# Patient Record
Sex: Female | Born: 1980 | Hispanic: No | Marital: Married | Smoking: Former smoker
Health system: Southern US, Community
[De-identification: ages and names within clinical notes are randomized; demographics above are authoritative.]

## PROBLEM LIST (undated history)

## (undated) DIAGNOSIS — B019 Varicella without complication: Secondary | ICD-10-CM

## (undated) DIAGNOSIS — R51 Headache: Secondary | ICD-10-CM

## (undated) DIAGNOSIS — R519 Headache, unspecified: Secondary | ICD-10-CM

## (undated) HISTORY — DX: Headache, unspecified: R51.9

## (undated) HISTORY — PX: BREAST BIOPSY: SHX20

## (undated) HISTORY — PX: OTHER SURGICAL HISTORY: SHX169

## (undated) HISTORY — DX: Headache: R51

## (undated) HISTORY — DX: Varicella without complication: B01.9

---

## 2009-07-13 ENCOUNTER — Other Ambulatory Visit: Admission: RE | Admit: 2009-07-13 | Discharge: 2009-07-13 | Payer: Self-pay | Admitting: *Deleted

## 2010-05-27 ENCOUNTER — Emergency Department (HOSPITAL_COMMUNITY): Admission: EM | Admit: 2010-05-27 | Discharge: 2010-05-05 | Payer: Self-pay | Admitting: Emergency Medicine

## 2010-11-22 ENCOUNTER — Other Ambulatory Visit (HOSPITAL_COMMUNITY)
Admission: RE | Admit: 2010-11-22 | Discharge: 2010-11-22 | Disposition: A | Discharge status: Home / Self Care | Payer: Managed Care, Other (non HMO) | Source: Ambulatory Visit | Attending: Internal Medicine | Admitting: Internal Medicine

## 2010-11-23 ENCOUNTER — Other Ambulatory Visit: Payer: Self-pay

## 2010-11-23 DIAGNOSIS — Z01419 Encounter for gynecological examination (general) (routine) without abnormal findings: Secondary | ICD-10-CM | POA: Insufficient documentation

## 2011-06-03 ENCOUNTER — Other Ambulatory Visit: Payer: Self-pay | Admitting: Family Medicine

## 2011-06-03 DIAGNOSIS — N63 Unspecified lump in unspecified breast: Secondary | ICD-10-CM

## 2011-06-03 DIAGNOSIS — N644 Mastodynia: Secondary | ICD-10-CM

## 2011-06-06 ENCOUNTER — Other Ambulatory Visit: Payer: Self-pay | Admitting: Family Medicine

## 2011-06-06 DIAGNOSIS — N63 Unspecified lump in unspecified breast: Secondary | ICD-10-CM

## 2011-06-06 DIAGNOSIS — N644 Mastodynia: Secondary | ICD-10-CM

## 2011-06-21 NOTE — L&D Delivery Note (Signed)
Delivery Note A viable female was delivered via  OA Presentation Apgars 9 9 weight pending  Placenta status: spontaneously, intact with 3  Vessel cordCord:  with the following complications: none  Cord pH: not obtained  Anesthesia:  epidural Episiotomy: none Lacerations: none Suture Repair: not applicable Est. Blood Loss (mL): 300  Mom to postpartum.  Baby to nursery-stable.  Catherine Hall L 02/20/2012, 7:54 AM

## 2011-06-27 ENCOUNTER — Ambulatory Visit
Admission: RE | Admit: 2011-06-27 | Discharge: 2011-06-27 | Disposition: A | Discharge status: Home / Self Care | Payer: BC Managed Care – PPO | Source: Ambulatory Visit | Attending: Family Medicine | Admitting: Family Medicine

## 2011-06-27 DIAGNOSIS — N63 Unspecified lump in unspecified breast: Secondary | ICD-10-CM

## 2011-06-27 DIAGNOSIS — N644 Mastodynia: Secondary | ICD-10-CM

## 2011-07-18 LAB — OB RESULTS CONSOLE GC/CHLAMYDIA
Chlamydia: NEGATIVE
Gonorrhea: NEGATIVE

## 2011-07-18 LAB — OB RESULTS CONSOLE RUBELLA ANTIBODY, IGM: Rubella: IMMUNE

## 2011-07-18 LAB — OB RESULTS CONSOLE HEPATITIS B SURFACE ANTIGEN: Hepatitis B Surface Ag: NEGATIVE

## 2012-02-20 ENCOUNTER — Encounter (HOSPITAL_COMMUNITY): Payer: Self-pay | Admitting: *Deleted

## 2012-02-20 ENCOUNTER — Encounter (HOSPITAL_COMMUNITY): Payer: Self-pay | Admitting: Anesthesiology

## 2012-02-20 ENCOUNTER — Inpatient Hospital Stay (HOSPITAL_COMMUNITY)
Admission: AD | Admit: 2012-02-20 | Discharge: 2012-02-21 | DRG: 373 | Disposition: A | Discharge status: Home / Self Care | Payer: BC Managed Care – PPO | Source: Ambulatory Visit | Attending: Obstetrics and Gynecology | Admitting: Obstetrics and Gynecology

## 2012-02-20 ENCOUNTER — Inpatient Hospital Stay (HOSPITAL_COMMUNITY): Payer: BC Managed Care – PPO | Admitting: Anesthesiology

## 2012-02-20 LAB — CBC
HCT: 36.8 % (ref 36.0–46.0)
Hemoglobin: 12.5 g/dL (ref 12.0–15.0)
MCH: 30.3 pg (ref 26.0–34.0)
MCV: 89.3 fL (ref 78.0–100.0)
Platelets: 117 10*3/uL — ABNORMAL LOW (ref 150–400)
RBC: 4.12 MIL/uL (ref 3.87–5.11)

## 2012-02-20 MED ORDER — OXYCODONE-ACETAMINOPHEN 5-325 MG PO TABS: 1.0000 | ORAL_TABLET | ORAL | Status: DC | PRN

## 2012-02-20 MED ORDER — PHENYLEPHRINE 40 MCG/ML (10ML) SYRINGE FOR IV PUSH (FOR BLOOD PRESSURE SUPPORT)
80.0000 ug | PREFILLED_SYRINGE | INTRAVENOUS | Status: DC | PRN
  Administered 2012-02-20: 80 ug via INTRAVENOUS
  Filled 2012-02-20: qty 5

## 2012-02-20 MED ORDER — WITCH HAZEL-GLYCERIN EX PADS: 1.0000 "application " | MEDICATED_PAD | CUTANEOUS | Status: DC | PRN

## 2012-02-20 MED ORDER — BISACODYL 10 MG RE SUPP: 10.0000 mg | Freq: Every day | RECTAL | Status: DC | PRN

## 2012-02-20 MED ORDER — LANOLIN HYDROUS EX OINT: TOPICAL_OINTMENT | CUTANEOUS | Status: DC | PRN

## 2012-02-20 MED ORDER — EPHEDRINE 5 MG/ML INJ
10.0000 mg | INTRAVENOUS | Status: DC | PRN
  Filled 2012-02-20: qty 4

## 2012-02-20 MED ORDER — EPHEDRINE 5 MG/ML INJ: 10.0000 mg | INTRAVENOUS | Status: DC | PRN

## 2012-02-20 MED ORDER — PHENYLEPHRINE 40 MCG/ML (10ML) SYRINGE FOR IV PUSH (FOR BLOOD PRESSURE SUPPORT): 80.0000 ug | PREFILLED_SYRINGE | INTRAVENOUS | Status: DC | PRN

## 2012-02-20 MED ORDER — CITRIC ACID-SODIUM CITRATE 334-500 MG/5ML PO SOLN: 30.0000 mL | ORAL | Status: DC | PRN

## 2012-02-20 MED ORDER — ZOLPIDEM TARTRATE 5 MG PO TABS: 5.0000 mg | ORAL_TABLET | Freq: Every evening | ORAL | Status: DC | PRN

## 2012-02-20 MED ORDER — DIPHENHYDRAMINE HCL 50 MG/ML IJ SOLN: 12.5000 mg | INTRAMUSCULAR | Status: DC | PRN

## 2012-02-20 MED ORDER — LACTATED RINGERS IV SOLN
INTRAVENOUS | Status: DC
  Administered 2012-02-20: 06:00:00 via INTRAVENOUS

## 2012-02-20 MED ORDER — OXYTOCIN BOLUS FROM INFUSION
250.0000 mL | Freq: Once | INTRAVENOUS | Status: DC
  Filled 2012-02-20: qty 500

## 2012-02-20 MED ORDER — MEASLES, MUMPS & RUBELLA VAC ~~LOC~~ INJ: 0.5000 mL | INJECTION | Freq: Once | SUBCUTANEOUS | Status: DC

## 2012-02-20 MED ORDER — FENTANYL 2.5 MCG/ML BUPIVACAINE 1/10 % EPIDURAL INFUSION (WH - ANES)
14.0000 mL/h | INTRAMUSCULAR | Status: DC
  Filled 2012-02-20: qty 60

## 2012-02-20 MED ORDER — LACTATED RINGERS IV SOLN
500.0000 mL | Freq: Once | INTRAVENOUS | Status: AC
  Administered 2012-02-20: 500 mL via INTRAVENOUS

## 2012-02-20 MED ORDER — OXYTOCIN 40 UNITS IN LACTATED RINGERS INFUSION - SIMPLE MED
62.5000 mL/h | Freq: Once | INTRAVENOUS | Status: DC
  Filled 2012-02-20: qty 1000

## 2012-02-20 MED ORDER — ONDANSETRON HCL 4 MG/2ML IJ SOLN: 4.0000 mg | Freq: Four times a day (QID) | INTRAMUSCULAR | Status: DC | PRN

## 2012-02-20 MED ORDER — IBUPROFEN 600 MG PO TABS
600.0000 mg | ORAL_TABLET | Freq: Four times a day (QID) | ORAL | Status: DC
  Administered 2012-02-20 – 2012-02-21 (×5): 600 mg via ORAL
  Filled 2012-02-20 (×5): qty 1

## 2012-02-20 MED ORDER — BENZOCAINE-MENTHOL 20-0.5 % EX AERO
1.0000 "application " | INHALATION_SPRAY | CUTANEOUS | Status: DC | PRN
  Filled 2012-02-20: qty 56

## 2012-02-20 MED ORDER — PRENATAL MULTIVITAMIN CH
1.0000 | ORAL_TABLET | Freq: Every day | ORAL | Status: DC
  Administered 2012-02-20 – 2012-02-21 (×2): 1 via ORAL
  Filled 2012-02-20 (×2): qty 1

## 2012-02-20 MED ORDER — IBUPROFEN 600 MG PO TABS: 600.0000 mg | ORAL_TABLET | Freq: Four times a day (QID) | ORAL | Status: DC | PRN

## 2012-02-20 MED ORDER — TETANUS-DIPHTH-ACELL PERTUSSIS 5-2.5-18.5 LF-MCG/0.5 IM SUSP: 0.5000 mL | Freq: Once | INTRAMUSCULAR | Status: DC

## 2012-02-20 MED ORDER — FLEET ENEMA 7-19 GM/118ML RE ENEM: 1.0000 | ENEMA | RECTAL | Status: DC | PRN

## 2012-02-20 MED ORDER — SIMETHICONE 80 MG PO CHEW: 80.0000 mg | CHEWABLE_TABLET | ORAL | Status: DC | PRN

## 2012-02-20 MED ORDER — SODIUM BICARBONATE 8.4 % IV SOLN
INTRAVENOUS | Status: DC | PRN
  Administered 2012-02-20: 4 mL via EPIDURAL

## 2012-02-20 MED ORDER — ONDANSETRON HCL 4 MG/2ML IJ SOLN: 4.0000 mg | INTRAMUSCULAR | Status: DC | PRN

## 2012-02-20 MED ORDER — ONDANSETRON HCL 4 MG PO TABS: 4.0000 mg | ORAL_TABLET | ORAL | Status: DC | PRN

## 2012-02-20 MED ORDER — MEDROXYPROGESTERONE ACETATE 150 MG/ML IM SUSP: 150.0000 mg | INTRAMUSCULAR | Status: DC | PRN

## 2012-02-20 MED ORDER — FLEET ENEMA 7-19 GM/118ML RE ENEM: 1.0000 | ENEMA | Freq: Every day | RECTAL | Status: DC | PRN

## 2012-02-20 MED ORDER — SENNOSIDES-DOCUSATE SODIUM 8.6-50 MG PO TABS
2.0000 | ORAL_TABLET | Freq: Every day | ORAL | Status: DC
  Administered 2012-02-20: 2 via ORAL

## 2012-02-20 MED ORDER — LIDOCAINE HCL (PF) 1 % IJ SOLN
30.0000 mL | INTRAMUSCULAR | Status: DC | PRN
  Filled 2012-02-20: qty 30

## 2012-02-20 MED ORDER — LACTATED RINGERS IV SOLN: 500.0000 mL | INTRAVENOUS | Status: DC | PRN

## 2012-02-20 MED ORDER — DIPHENHYDRAMINE HCL 25 MG PO CAPS: 25.0000 mg | ORAL_CAPSULE | Freq: Four times a day (QID) | ORAL | Status: DC | PRN

## 2012-02-20 MED ORDER — DIBUCAINE 1 % RE OINT: 1.0000 "application " | TOPICAL_OINTMENT | RECTAL | Status: DC | PRN

## 2012-02-20 MED ORDER — ACETAMINOPHEN 325 MG PO TABS: 650.0000 mg | ORAL_TABLET | ORAL | Status: DC | PRN

## 2012-02-20 MED ORDER — FENTANYL 2.5 MCG/ML BUPIVACAINE 1/10 % EPIDURAL INFUSION (WH - ANES)
INTRAMUSCULAR | Status: DC | PRN
  Administered 2012-02-20: 14 mL/h via EPIDURAL

## 2012-02-20 NOTE — MAU Note (Signed)
PT SAYS SHE STARTED HURTING BAD AT 0305..  2 CM IN OFFICE  LAST WEEK.  DENIES HSV AND MRSA

## 2012-02-20 NOTE — Anesthesia Procedure Notes (Addendum)
Epidural Patient location during procedure: OB  Preanesthetic Checklist Completed: patient identified, site marked, surgical consent, pre-op evaluation, timeout performed, IV checked, risks and benefits discussed and monitors and equipment checked  Epidural Patient position: sitting Prep: site prepped and draped and DuraPrep Patient monitoring: continuous pulse ox and blood pressure Approach: midline Injection technique: LOR air  Needle:  Needle type: Tuohy  Needle gauge: 17 G Needle length: 9 cm and 9 Needle insertion depth: 3 cm Catheter type: closed end flexible Catheter size: 19 Gauge Catheter at skin depth: 9 cm Test dose: negative  Assessment Events: blood not aspirated, injection not painful, no injection resistance, negative IV test and no paresthesia  Additional Notes Dosing of Epidural:  1st dose, through needle ............................................Marland Kitchen epi 1:200K + Xylocaine 40 mg  2nd dose, through catheter, after waiting 3 minutes...Marland KitchenMarland Kitchenepi 1:200K + Xylocaine 40 mg  3rd dose, through catheter after waiting 3 minutes .............................Marcaine   4mg    ( mg Marcaine are expressed as equivilent  cc's medication removed from the 0.1%Bupiv / fentanyl syringe from L&D pump)  ( 2% Xylo charted as a single dose in Epic Meds for ease of charting; actual dosing was fractionated as above, for saftey's sake)  As each dose occurred, patient was free of IV sx; and patient exhibited no evidence of SA injection.  Patient is more comfortable after epidural dosed. Please see RN's note for documentation of vital signs,and FHR which are stable.  Patient reminded not to try to ambulate with numb legs, and that an RN must be present the 1st time she attempts to get up.

## 2012-02-20 NOTE — MAU Note (Signed)
Pt reports contractions, spotting.

## 2012-02-20 NOTE — Anesthesia Preprocedure Evaluation (Signed)

## 2012-02-20 NOTE — Progress Notes (Signed)
MD in- Pt waiting on epidural. MD will come back after epidural.

## 2012-02-20 NOTE — H&P (Signed)
31 year old G 4 P 2012 at 39 weeks presents in active labor. PNC See [place] NSVD x 2  GBBS is negative Afebrile Vital signs stable General alert and oriented Lung CTAB Car RRR Abdomen is soft and non tender Cervix is 7 - 8 cm 90% -1 per triage nurse  IMPRESSION: IUP at term Labor  PLAN: Patient wants epidural AROM after epidural

## 2012-02-20 NOTE — Progress Notes (Signed)
Patient is comfortable after epidural Fetal heart rate category 1 toco ucs every 3 minutes Cervix is C/9/0 AROM Clear fluid  IMPRESSION: IUP at term Labor  PLAN: Anticipate NSVD

## 2012-02-21 LAB — CBC
MCV: 90.5 fL (ref 78.0–100.0)
Platelets: 115 10*3/uL — ABNORMAL LOW (ref 150–400)
RBC: 3.37 MIL/uL — ABNORMAL LOW (ref 3.87–5.11)
RDW: 13.5 % (ref 11.5–15.5)
WBC: 10.4 10*3/uL (ref 4.0–10.5)

## 2012-02-21 MED ORDER — IBUPROFEN 600 MG PO TABS: 600.0000 mg | ORAL_TABLET | Freq: Four times a day (QID) | ORAL | Status: AC

## 2012-02-21 NOTE — Progress Notes (Signed)
Post Partum Day 1 Subjective: no complaints, up ad lib, voiding, tolerating PO and desires early discharge  Objective: Blood pressure 96/61, pulse 83, temperature 97.6 F (36.4 C), temperature source Oral, resp. rate 18, height 5\' 6"  (1.676 m), weight 58.968 kg (130 lb), SpO2 97.00%, unknown if currently breastfeeding.  Physical Exam:  General: alert and cooperative Lochia: appropriate Uterine Fundus: firm Incision: perineum intact DVT Evaluation: No evidence of DVT seen on physical exam.   Basename 02/21/12 0522 02/20/12 0535  HGB 10.5* 12.5  HCT 30.5* 36.8    Assessment/Plan: Discharge home   LOS: 1 day   Anthonette Lesage G 02/21/2012, 8:12 AM

## 2012-02-21 NOTE — Anesthesia Postprocedure Evaluation (Signed)
  Anesthesia Post-op Note  Patient: Catherine Hall  Procedure(s) Performed: * No procedures listed *  Patient Location: PACU and Mother/Baby  Anesthesia Type: Epidural  Level of Consciousness: awake, alert  and oriented  Airway and Oxygen Therapy: Patient Spontanous Breathing  Post-op Pain: mild  Post-op Assessment: Patient's Cardiovascular Status Stable, Respiratory Function Stable, No signs of Nausea or vomiting and Pain level controlled  Post-op Vital Signs: stable  Complications: No apparent anesthesia complications

## 2012-02-21 NOTE — Discharge Summary (Signed)
Obstetric Discharge Summary Reason for Admission: onset of labor Prenatal Procedures: ultrasound Intrapartum Procedures: spontaneous vaginal delivery Postpartum Procedures: none Complications-Operative and Postpartum: none Hemoglobin  Date Value Range Status  02/21/2012 10.5* 12.0 - 15.0 g/dL Final     HCT  Date Value Range Status  02/21/2012 30.5* 36.0 - 46.0 % Final    Physical Exam:  General: alert and cooperative Lochia: appropriate Uterine Fundus: firm Incision: perineum intact DVT Evaluation: No evidence of DVT seen on physical exam.  Discharge Diagnoses: Term Pregnancy-delivered  Discharge Information: Date: 02/21/2012 Activity: pelvic rest Diet: routine Medications: PNV and Ibuprofen Condition: stable Instructions: refer to practice specific booklet Discharge to: home   Newborn Data: Live born female  Birth Weight: 6 lb 9.8 oz (2999 g) APGAR: 8, 9  Home with mother.  Orian Amberg G 02/21/2012, 8:20 AM

## 2012-02-23 ENCOUNTER — Encounter (HOSPITAL_COMMUNITY): Payer: Self-pay | Admitting: *Deleted

## 2012-02-29 ENCOUNTER — Encounter (HOSPITAL_COMMUNITY): Payer: Self-pay | Admitting: *Deleted

## 2012-03-20 LAB — HM PAP SMEAR: HM Pap smear: NORMAL

## 2012-03-22 ENCOUNTER — Other Ambulatory Visit: Payer: Self-pay | Admitting: Obstetrics and Gynecology

## 2012-12-31 ENCOUNTER — Emergency Department (HOSPITAL_COMMUNITY): Payer: BC Managed Care – PPO

## 2012-12-31 ENCOUNTER — Encounter (HOSPITAL_COMMUNITY): Payer: Self-pay | Admitting: *Deleted

## 2012-12-31 ENCOUNTER — Emergency Department (HOSPITAL_COMMUNITY)
Admission: EM | Admit: 2012-12-31 | Discharge: 2012-12-31 | Discharge status: Against Medical Advice | Payer: BC Managed Care – PPO | Attending: Emergency Medicine | Admitting: Emergency Medicine

## 2012-12-31 DIAGNOSIS — Z79899 Other long term (current) drug therapy: Secondary | ICD-10-CM | POA: Insufficient documentation

## 2012-12-31 DIAGNOSIS — R0602 Shortness of breath: Secondary | ICD-10-CM | POA: Insufficient documentation

## 2012-12-31 DIAGNOSIS — R072 Precordial pain: Secondary | ICD-10-CM | POA: Insufficient documentation

## 2012-12-31 DIAGNOSIS — R079 Chest pain, unspecified: Secondary | ICD-10-CM

## 2012-12-31 DIAGNOSIS — Z87891 Personal history of nicotine dependence: Secondary | ICD-10-CM | POA: Insufficient documentation

## 2012-12-31 LAB — BASIC METABOLIC PANEL
BUN: 11 mg/dL (ref 6–23)
CO2: 31 mEq/L (ref 19–32)
Chloride: 101 mEq/L (ref 96–112)
Creatinine, Ser: 0.63 mg/dL (ref 0.50–1.10)
GFR calc Af Amer: >90 mL/min (ref >90)
Glucose, Bld: 117 mg/dL — ABNORMAL HIGH (ref 70–99)
Potassium: 4 mEq/L (ref 3.5–5.1)

## 2012-12-31 LAB — CBC
HCT: 40.6 % (ref 36.0–46.0)
Hemoglobin: 13.9 g/dL (ref 12.0–15.0)
MCV: 87.1 fL (ref 78.0–100.0)
RBC: 4.66 MIL/uL (ref 3.87–5.11)
RDW: 12.3 % (ref 11.5–15.5)
WBC: 5.1 10*3/uL (ref 4.0–10.5)

## 2012-12-31 MED ORDER — GI COCKTAIL ~~LOC~~
30.0000 mL | Freq: Once | ORAL | Status: AC
  Administered 2012-12-31: 30 mL via ORAL
  Filled 2012-12-31: qty 30

## 2012-12-31 NOTE — ED Provider Notes (Signed)
History    CSN: [id number] Arrival date & time 12/31/12  1555  First MD Initiated Contact with Patient 12/31/12 1711     Chief Complaint  Patient presents with  . Chest Pain  . Shortness of Breath   (Consider location/radiation/quality/duration/timing/severity/associated sxs/prior Treatment) HPI Comments: 32 year old healthy female presents to the emergency department complaining of chest pain intermittently over the past month. States initially at night she began to notice that she had to "think about taking a breath" and over the past month this has become more prominent throughout the day with associated midsternal chest pressure and occasional sharp stabbing pain in the mid to left side of her chest. About 2 weeks ago [facility] be [facility] and was sent to a freestanding imaging center to get a CT scan to rule out pulmonary embolism which was negative for any acute abnormality. Denies shortness of breath, just states she has to occasionally "concentrate on breathing". Nothing in specific makes her symptoms come or go. She is not on any exogenous estrogen, nonsmoker, no recent long travel. She did give birth 10 months back. Has not tried any alleviating factors for her symptoms. Denies associated nausea, vomiting, fever, chills, palpitations. Does not feel as if she is under increased stress or anxious. Denies personal or family history of heart problems.  Patient is a 32 y.o. female presenting with chest pain and shortness of breath. The history is provided by the patient.  Chest Pain Associated symptoms: no back pain, no cough, no dizziness, no fever, no nausea, no palpitations, no shortness of breath and not vomiting   Shortness of Breath Associated symptoms: chest pain   Associated symptoms: no cough, no fever, no vomiting and no wheezing    History reviewed. No pertinent past medical history. History reviewed. No pertinent past surgical history. Family History   Problem Relation Age of Onset  . Hypertension Mother   . Diabetes Mother   . Cancer Mother    History  Substance Use Topics  . Smoking status: Former Games developer  . Smokeless tobacco: Not on file  . Alcohol Use: No   OB History   Grav Para Term Preterm Abortions TAB SAB Ect Mult Living   4 3 3  1 1    3      Review of Systems  Constitutional: Negative for fever and chills.  Respiratory: Negative for cough, shortness of breath and wheezing.   Cardiovascular: Positive for chest pain. Negative for palpitations.  Gastrointestinal: Negative for nausea and vomiting.  Musculoskeletal: Negative for back pain.  Neurological: Negative for dizziness and light-headedness.  Psychiatric/Behavioral: The patient is not nervous/anxious.   All other systems reviewed and are negative.    Allergies  Review of patient's allergies indicates no known allergies.  Home Medications   Current Outpatient Rx  Name  Route  Sig  Dispense  Refill  . Prenatal Vit-Fe Fumarate-FA (PRENATAL MULTIVITAMIN) TABS   Oral   Take 1 tablet by mouth every evening.          BP 109/68  Pulse 88  Temp(Src) 97.6 F (36.4 C) (Oral)  Resp 18  SpO2 99%  Breastfeeding? Yes Physical Exam  Nursing note and vitals reviewed. Constitutional: She is oriented to person, place, and time. She appears well-developed and well-nourished. No distress.  HENT:  Head: Normocephalic and atraumatic.  Mouth/Throat: Oropharynx is clear and moist.  Eyes: Conjunctivae and EOM are normal. Pupils are equal, round, and reactive to light.  Neck: Normal  range of motion. Neck supple.  Cardiovascular: Normal rate, regular rhythm and normal heart sounds.   Pulmonary/Chest: Effort normal and breath sounds normal. No respiratory distress. She has no wheezes. She has no rales. She exhibits no tenderness.  Abdominal: Soft. Bowel sounds are normal. She exhibits no distension. There is no tenderness.  Musculoskeletal: Normal range of motion. She  exhibits no edema.  Neurological: She is alert and oriented to person, place, and time.  Skin: Skin is warm and dry. She is not diaphoretic.  Psychiatric: She has a normal mood and affect. Her behavior is normal.    ED Course  Procedures (including critical care time) Labs Reviewed  BASIC METABOLIC PANEL - Abnormal; Notable for the following:    Glucose, Bld 117 (*)    All other components within normal limits  CBC  POCT I-STAT TROPONIN I    Date: 12/31/2012  Rate: 74  Rhythm: normal sinus rhythm  QRS Axis: normal  Intervals: normal  ST/T Wave abnormalities: normal  Conduction Disutrbances:none  Narrative Interpretation: no stemi  Old EKG Reviewed: none available   Dg Chest 2 View  12/31/2012   *RADIOLOGY REPORT*  Clinical Data: Chest pain and shortness of breath.  CHEST - 2 VIEW  Comparison: Chest CT 12/02/2012  Findings: Two views of the chest demonstrate clear lungs.  There appears to be a 1 cm nodular density overlying the left second and left fifth ribs.  This probably represents overlying shadows because there was no suspicious lung lesion on the recent chest CT. Heart and mediastinum are within normal limits.  Bony thorax is intact.  IMPRESSION: No acute cardiopulmonary disease.   Original Report Authenticated By: Richarda Overlie, M.D.   1. Chest pain     MDM  Patient with chest pain, concentrating on breathing. EKG normal. Labs obtained in triage prior to patient being seen, troponin negative and labs otherwise unremarkable. Chest x-ray normal. She is or even evaluated by urgent care to rule out pulmonary embolism. Her vitals are stable here in the emergency department. No bruits factors for cardiac disease, no family history. I do not feel her symptoms are cardiac related, however bladder followup with cardiology as an outpatient. She has an appointment scheduled next week with [facility] to establish care with primary care physician. In the emergency department we  will give her a GI cocktail and reevaluate her pain to see if any improvement. Patient discussed with Dr. Hyacinth Meeker who agrees with plan of care. 7:08 PM Patient left the ED after receiving GI cocktail, before re-evaluation.  Trevor Mace, PA-C 12/31/12 Windell Moment

## 2012-12-31 NOTE — ED Notes (Signed)
No changes from triage assessment 

## 2012-12-31 NOTE — ED Notes (Signed)
PA went into the room and patient was not in the room, she didn't come tot he desk to let anyone know that she was leaving.

## 2012-12-31 NOTE — ED Notes (Signed)
Pt is here with intermittent chest pain and shortness of breath over the last couple of months.  Pt feels like she is having to concentrate on breathing.  Pt feels like left arm is tingling.

## 2012-12-31 NOTE — ED Provider Notes (Signed)
Medical screening examination/treatment/procedure(s) were performed by non-physician practitioner and as supervising physician I was immediately available for consultation/collaboration.    Vida Roller, MD 12/31/12 2009

## 2013-01-08 ENCOUNTER — Ambulatory Visit (INDEPENDENT_AMBULATORY_CARE_PROVIDER_SITE_OTHER): Payer: BC Managed Care – PPO | Admitting: Family Medicine

## 2013-01-08 ENCOUNTER — Encounter: Payer: Self-pay | Admitting: Family Medicine

## 2013-01-08 VITALS — BP 110/68 | HR 88 | Temp 98.6°F | Ht 66.5 in | Wt 110.0 lb

## 2013-01-08 DIAGNOSIS — R0602 Shortness of breath: Secondary | ICD-10-CM | POA: Insufficient documentation

## 2013-01-08 DIAGNOSIS — Z7189 Other specified counseling: Secondary | ICD-10-CM

## 2013-01-08 DIAGNOSIS — J309 Allergic rhinitis, unspecified: Secondary | ICD-10-CM | POA: Insufficient documentation

## 2013-01-08 DIAGNOSIS — Z7689 Persons encountering health services in other specified circumstances: Secondary | ICD-10-CM

## 2013-01-08 DIAGNOSIS — R0789 Other chest pain: Secondary | ICD-10-CM

## 2013-01-08 LAB — LIPID PANEL
Cholesterol: 165 mg/dL (ref 0–200)
Triglycerides: 26 mg/dL (ref 0.0–149.0)
VLDL: 5.2 mg/dL (ref 0.0–40.0)

## 2013-01-08 MED ORDER — FLUTICASONE PROPIONATE 50 MCG/ACT NA SUSP: 2.0000 | Freq: Every day | NASAL | Status: DC

## 2013-01-08 NOTE — Patient Instructions (Addendum)
-  We have ordered labs or studies at this visit. It can take up to 1-2 weeks for results and processing. We will contact you with instructions IF your results are abnormal. Normal results will be released to your [facility]. If you have not heard from [place] or can not find your results in [facility] in 2 weeks please contact our office.  -PLEASE SIGN UP FOR MYCHART TODAY   We recommend the following healthy lifestyle measures: - eat a healthy diet consisting of lots of vegetables, fruits, beans, nuts, seeds, healthy meats such as white chicken and fish and whole grains.  - avoid fried foods, fast food, processed foods, sodas, red meet and other fattening foods.  - get a least 150 minutes of aerobic exercise per week.   -We placed a referral for you as discussed to the pulmonologist as you requested. It usually takes about 1-2 weeks to process and schedule this referral. If you have not heard from [place] regarding this appointment in 2 weeks please contact our office.  -start flonase - 2 sprays each nostril daily for 1 month, then 1 spray each nostril daily  Follow up in: after seeing pulmonologist if continued issues with chest tightness, breathing

## 2013-01-08 NOTE — Progress Notes (Signed)
Quick Note:  Left a message for pt to return call. ______ 

## 2013-01-08 NOTE — Progress Notes (Signed)
Chief Complaint  Patient presents with  . Establish Care  . chest pain and difficulty breathing    HPI:  Catherine Hall is here to establish care. Used to go to [facility] - but only went there a few times. Last PCP and physical: Dr[facility] pap normal.  Has the following chronic problems and concerns today:  Mild occ "sense of breathing" not really SOB or dyspnea, intermittently worse but there all the time for about 1-2 months, ache in chest, feels like punched in chest - notices more when lying down to go to sleep or when talking a lot - "has to focus so much on breathing, feels like breathing is tight" - she has tried albuterol a few times when symptoms are bad it helps a little -activity does NOT worsen CP, SOB - seems to occur more at rest -reports told should see a pulmonologist -has been evaluated in ED and U[facility] - with neg labs, CT chest, EKGs and workup for ACS -GI cocktail did not help per her report -she does have chronic nasal congestion with PND year round -denies: DOE, cough, fevers, weight loss, wheezing, apnea, palpitations, swelling, acid reflux, hx of asthma, allergies, cough, sneezing, itchy nose or eyes, anxiety, fam hx of sudden death or cardiac issues  Patient Active Problem List   Diagnosis Date Noted  . Atypical chest pain 01/08/2013  . Shortness of breath 01/08/2013  . Allergic rhinitis 01/08/2013   Health Maintenance: -UTD  ROS: See pertinent positives and negatives per HPI.  Past Medical History  Diagnosis Date  . Chicken pox   . Frequent headaches     Family History  Problem Relation Age of Onset  . Hypertension Mother   . Diabetes Mother   . Cancer Mother     breast cancer    History   Social History  . Marital Status: Married    Spouse Name: N/A    Number of Children: N/A  . Years of Education: N/A   Social History Main Topics  . Smoking status: Former Games developer  . Smokeless tobacco: None      Comment: from age [age], light smoker  . Alcohol Use: No  . Drug Use: No  . Sexually Active: Yes   Other Topics Concern  . 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 For wheezing, Disp: , Rfl: ;  fluticasone (FLONASE) 50 MCG/ACT nasal spray, Place 2 sprays into the nose daily., Disp: 16 g, Rfl: 6  EXAM:  Filed Vitals:   01/08/13 0814  BP: 110/68  Pulse: 88  Temp: 98.6 F (37 C)    Body mass index is 17.49 kg/(m^2).  GENERAL: vitals reviewed and listed above, alert, oriented, appears well hydrated and in no acute distress  HEENT: atraumatic, conjunttiva clear, no obvious abnormalities on inspection of external nose and ears, normal appearance of ear canals and TMs, clear nasal  congestion with very boggy pale turbinates, mild post oropharyngeal erythema with PND and cobblestoning, no tonsillar edema or exudate, no sinus TTP  NECK: no obvious masses on inspection  LUNGS: clear to auscultation bilaterally, no wheezes, rales or rhonchi, good air movement  CV: HRRR, no clicks/rubs or murmurs in sitting and valsalva and leaning forward position, no peripheral edema  MS: moves all extremities without noticeable abnormality - No chest wall or cc TTP  PSYCH: pleasant and cooperative, no obvious depression or anxiety  ASSESSMENT AND PLAN:  Discussed the following assessment and plan:  1)Shortness of breath - Plan: Ambulatory referral to Pulmonology 2)Atypical chest pain - Plan: Ambulatory referral to  Pulmonology -unsure of etiology, has had fairly extensive work up, does not sound cardiac in nature and has very normal exam other then some findings to suggest allergic rhinitis. Hx and exam do not support GI or musculoskeletal cause. Perhaps mild asthma and she would like to see pulmonologist - referral placed. She did ask about seeing cardiologist, then decided to hold off on this - did offer this referral should she decide she wants this. In meantime will tx allergies, advised of proper use of albuterol.  Encounter to establish care - Plan: Lipid Panel, Hemoglobin A1c  Allergic rhinitis - Plan: fluticasone (FLONASE) 50 MCG/ACT nasal spray  -We reviewed the PMH, PSH, FH, SH, Meds and Allergies. -We provided refills for any medications we will prescribe as needed. -We addressed current concerns per orders and patient instructions. -We have asked for records for pertinent exams, studies, vaccines and notes from previous providers. -We have advised patient to follow up per instructions below.   -Patient advised to return or notify a doctor immediately if symptoms worsen or persist or new concerns arise.  Patient Instructions  -We have ordered labs or studies at this visit. It can take up to 1-2 weeks for results and processing. We will contact you with instructions IF your results are abnormal. Normal results will be released to your [facility]. If you have not heard from [place] or can not find your results in [facility] in 2 weeks please contact our office.  -PLEASE SIGN UP FOR MYCHART TODAY   We recommend the following healthy lifestyle measures: - eat a healthy diet consisting of lots of vegetables, fruits, beans, nuts, seeds, healthy meats such as white chicken and fish and whole grains.  - avoid fried foods, fast food, processed foods, sodas, red meet and other fattening foods.  - get a least 150 minutes of aerobic exercise per week.   -We placed a referral for you as discussed to the  pulmonologist as you requested. It usually takes about 1-2 weeks to process and schedule this referral. If you have not heard from [place] regarding this appointment in 2 weeks please contact our office.  -start flonase - 2 sprays each nostril daily for 1 month, then 1 spray each nostril daily  Follow up in: after seeing pulmonologist if continued issues with chest tightness, breathing       KIM, HANNAH R.

## 2013-01-09 NOTE — Progress Notes (Signed)
Quick Note:  Called and spoke with pt and pt is aware. ______ 

## 2013-01-17 ENCOUNTER — Institutional Professional Consult (permissible substitution): Payer: BC Managed Care – PPO | Admitting: Internal Medicine

## 2013-01-25 ENCOUNTER — Ambulatory Visit (INDEPENDENT_AMBULATORY_CARE_PROVIDER_SITE_OTHER): Payer: BC Managed Care – PPO | Admitting: Internal Medicine

## 2013-01-25 ENCOUNTER — Encounter: Payer: Self-pay | Admitting: Internal Medicine

## 2013-01-25 ENCOUNTER — Telehealth: Payer: Self-pay | Admitting: *Deleted

## 2013-01-25 ENCOUNTER — Telehealth: Payer: Self-pay | Admitting: Internal Medicine

## 2013-01-25 VITALS — BP 90/50 | HR 72 | Temp 97.8°F | Ht 66.0 in | Wt 111.4 lb

## 2013-01-25 DIAGNOSIS — R0609 Other forms of dyspnea: Secondary | ICD-10-CM

## 2013-01-25 DIAGNOSIS — R0602 Shortness of breath: Secondary | ICD-10-CM

## 2013-01-25 DIAGNOSIS — R0600 Dyspnea, unspecified: Secondary | ICD-10-CM

## 2013-01-25 MED ORDER — OMEPRAZOLE 20 MG PO CPDR: 20.0000 mg | DELAYED_RELEASE_CAPSULE | Freq: Every day | ORAL | Status: DC

## 2013-01-25 MED ORDER — FAMOTIDINE 20 MG PO TABS: 20.0000 mg | ORAL_TABLET | Freq: Every day | ORAL | Status: DC

## 2013-01-25 NOTE — Telephone Encounter (Signed)
Spoke with patient-- Patient requesting rx be sent to pharmacy on prilosec and pepcid Rx has been sent to verified pharamcy Patient is aware Nothing further needed at this time

## 2013-01-25 NOTE — Telephone Encounter (Signed)
Pt returned call. Catherine Hall °

## 2013-01-25 NOTE — Telephone Encounter (Signed)
Message copied by Christen Butter on Fri Jan 25, 2013  5:03 PM ------      Message from: Sandrea Hughs B      Created: Fri Jan 25, 2013  4:43 PM       After reviewing all her labs and studies in the computer, she needs a TSH, BNP and D-dimer to complete the w/u and should come by early next week if possible (sorry I didn't have a chance to get through her records til late today) ------

## 2013-01-25 NOTE — Progress Notes (Signed)
Subjective:     Patient ID: Catherine Hall, female   DOB: April 13, 1981 MRN: [id number]  HPI  32 yowf minimal smoking hx at age [age] and no resp problems lifelong watery rhinorrhea both sides no change season, time of day and did fine with with 3 IUP's completed Sept 2013 s complications then 08/2012 onset of resting sob referred 01/25/2013 to pulmonary clinic by Dr Selena Batten  01/25/2013 1st pulmonary eval/ Landra Howze cc acute onset one day while lying flat sense can't get her breath and hasn't changed since. Not aware of it while sleeping, doesn't wake up prematurely, not limiting from activities, still nursing. Went to er 7/14 with assoc L arm pain since pregnancy with nl ekg.   Aware of symptoms when walking but they are no worse walking than sitting. No better with albuterol, drippy nose better with flonase  No obvious daytime variabilty or assoc chronic cough chest tightness, subjective wheeze overt sinus or hb symptoms. No unusual exp hx or h/o childhood pna/ asthma or knowledge of premature birth.   Sleeping ok without nocturnal  or early am exacerbation  of respiratory  c/o's or need for noct saba. Also denies any obvious fluctuation of symptoms with weather or environmental changes or other aggravating or alleviating factors except as outlined above    Current Medications, Allergies, Past Medical History, Past Surgical History, Family History, and Social History were reviewed in [employer] record.  ROS  The following are not active complaints unless bolded sore throat, dysphagia, dental problems, itching, sneezing,  nasal congestion or excess/ purulent secretions, ear ache,   fever, chills, sweats, unintended wt loss, pleuritic or exertional cp, hemoptysis,  orthopnea pnd or leg swelling, presyncope, palpitations, heartburn, abdominal pain, anorexia, nausea, vomiting, diarrhea  or change in bowel or urinary habits, change in stools or urine, dysuria,hematuria,  rash, arthralgias, visual  complaints, headache, numbness weakness or ataxia or problems with walking or coordination,  change in mood/affect or memory.        Review of Systems     Objective:   Physical Exam Thin amb wf nad Wt Readings from Last 3 Encounters:  01/25/13 111 lb 6 oz (50.519 kg)  01/08/13 110 lb (49.896 kg)  02/20/12 130 lb (58.968 kg)     HEENT: nl dentition, turbinates, and orophanx. Nl external ear canals without cough reflex   NECK :  without JVD/Nodes/TM/ nl carotid upstrokes bilaterally   LUNGS: no acc muscle use, clear to A and P bilaterally without cough on insp or exp maneuvers   CV:  RRR  no s3 or murmur or increase in P2, no edema   ABD:  soft and nontender with nl excursion in the supine position. No bruits or organomegaly, bowel sounds nl  MS:  warm without deformities, calf tenderness, cyanosis or clubbing  SKIN: warm and dry without lesions    NEURO:  alert, approp, no deficits     cxr 12/31/12 No acute cardiopulmonary disease.     Assessment:

## 2013-01-25 NOTE — Assessment & Plan Note (Addendum)
-   01/25/2013  Walked RA x 3 laps @ 185 ft each stopped due to  End of study, no sob, no desat  Symptoms are markedly disproportionate to objective findings and not clear this is a lung problem but pt does appear to have difficult airway management issues.   DDX of  difficult airways managment all start with A and  include Adherence, Ace Inhibitors, Acid Reflux, Active Sinus Disease, Alpha 1 Antitripsin deficiency, Anxiety masquerading as Airways dz,  ABPA,  allergy(esp in young), Aspiration (esp in elderly), Adverse effects of DPI,  Active smokers, plus two Bs  = Bronchiectasis and Beta blocker use..and one C= CHF   ? Acid reflux > not a typical body habitus but would explain also the midline cp  ? Anxiety > dx of exclusion typically but the fact the symptoms are the same lying down but not sleeping, sitting but not worse with activity all favor panic pattern breathing  Will check tsh/ bnp/d dimer to be complete and consider echo/ cpst next in w/u if not responding to reassurance, diet/ gerd rx.

## 2013-01-25 NOTE — Patient Instructions (Addendum)
Try prilosec 20mg   Take 30-60 min before first meal of the day and Pepcid 20 mg one bedtime  X one month  GERD (REFLUX)  is an extremely common cause of respiratory symptoms, many times with no significant heartburn at all.    It can be treated with medication, but also with lifestyle changes including avoidance of late meals, excessive alcohol, smoking cessation, and avoid fatty foods, chocolate, peppermint, colas, red wine, and acidic juices such as orange juice.  NO MINT OR MENTHOL PRODUCTS SO NO COUGH DROPS  USE SUGARLESS CANDY INSTEAD (jolley ranchers or Stover's)  NO OIL BASED VITAMINS - use powdered substitutes.  No undercooked vegetables, salads, boiled eggs, beans and food you know causes gas

## 2013-01-25 NOTE — Telephone Encounter (Signed)
Spoke with pt and notified of recs per MW She verbalized understanding  Will come by the lab on 01/28/13 for labs

## 2013-01-25 NOTE — Telephone Encounter (Signed)
ATC patient, no answer [id number] 

## 2013-01-28 ENCOUNTER — Other Ambulatory Visit (INDEPENDENT_AMBULATORY_CARE_PROVIDER_SITE_OTHER): Payer: BC Managed Care – PPO

## 2013-01-28 ENCOUNTER — Encounter: Payer: Self-pay | Admitting: Internal Medicine

## 2013-01-28 DIAGNOSIS — R0602 Shortness of breath: Secondary | ICD-10-CM

## 2013-01-28 LAB — D-DIMER, QUANTITATIVE: D-Dimer, Quant: 0.27 ug/mL-FEU (ref 0.00–0.48)

## 2013-01-29 NOTE — Progress Notes (Signed)
Quick Note:  Spoke with pt and notified of results per Dr. Wert. Pt verbalized understanding and denied any questions.  ______ 

## 2013-04-25 ENCOUNTER — Other Ambulatory Visit: Payer: Self-pay

## 2013-05-17 ENCOUNTER — Emergency Department (HOSPITAL_COMMUNITY): Payer: BC Managed Care – PPO

## 2013-05-17 ENCOUNTER — Emergency Department (HOSPITAL_COMMUNITY)
Admission: EM | Admit: 2013-05-17 | Discharge: 2013-05-17 | Disposition: A | Discharge status: Home / Self Care | Payer: BC Managed Care – PPO | Attending: Emergency Medicine | Admitting: Emergency Medicine

## 2013-05-17 ENCOUNTER — Encounter (HOSPITAL_COMMUNITY): Payer: Self-pay | Admitting: Emergency Medicine

## 2013-05-17 DIAGNOSIS — R209 Unspecified disturbances of skin sensation: Secondary | ICD-10-CM | POA: Insufficient documentation

## 2013-05-17 DIAGNOSIS — Z87891 Personal history of nicotine dependence: Secondary | ICD-10-CM | POA: Insufficient documentation

## 2013-05-17 DIAGNOSIS — R519 Headache, unspecified: Secondary | ICD-10-CM

## 2013-05-17 DIAGNOSIS — M79609 Pain in unspecified limb: Secondary | ICD-10-CM | POA: Insufficient documentation

## 2013-05-17 DIAGNOSIS — R42 Dizziness and giddiness: Secondary | ICD-10-CM | POA: Insufficient documentation

## 2013-05-17 DIAGNOSIS — R51 Headache: Secondary | ICD-10-CM | POA: Insufficient documentation

## 2013-05-17 DIAGNOSIS — R202 Paresthesia of skin: Secondary | ICD-10-CM

## 2013-05-17 DIAGNOSIS — Z3202 Encounter for pregnancy test, result negative: Secondary | ICD-10-CM | POA: Insufficient documentation

## 2013-05-17 DIAGNOSIS — Z8619 Personal history of other infectious and parasitic diseases: Secondary | ICD-10-CM | POA: Insufficient documentation

## 2013-05-17 DIAGNOSIS — R110 Nausea: Secondary | ICD-10-CM | POA: Insufficient documentation

## 2013-05-17 LAB — CBC WITH DIFFERENTIAL/PLATELET
Basophils Absolute: 0 10*3/uL (ref 0.0–0.1)
Basophils Relative: 1 % (ref 0–1)
Eosinophils Absolute: 0.1 10*3/uL (ref 0.0–0.7)
Eosinophils Relative: 2 % (ref 0–5)
HCT: 36.8 % (ref 36.0–46.0)
Hemoglobin: 12.6 g/dL (ref 12.0–15.0)
Lymphocytes Relative: 38 % (ref 12–46)
Lymphs Abs: 2.4 10*3/uL (ref 0.7–4.0)
MCH: 30.4 pg (ref 26.0–34.0)
MCHC: 34.2 g/dL (ref 30.0–36.0)
MCV: 88.7 fL (ref 78.0–100.0)
Monocytes Absolute: 0.7 10*3/uL (ref 0.1–1.0)
Monocytes Relative: 11 % (ref 3–12)
Neutro Abs: 3.1 10*3/uL (ref 1.7–7.7)
Neutrophils Relative %: 49 % (ref 43–77)
Platelets: 164 10*3/uL (ref 150–400)
RBC: 4.15 MIL/uL (ref 3.87–5.11)
RDW: 12.5 % (ref 11.5–15.5)
WBC: 6.4 10*3/uL (ref 4.0–10.5)

## 2013-05-17 LAB — PREGNANCY, URINE: Preg Test, Ur: NEGATIVE

## 2013-05-17 LAB — URINE MICROSCOPIC-ADD ON

## 2013-05-17 LAB — URINALYSIS, ROUTINE W REFLEX MICROSCOPIC
Bilirubin Urine: NEGATIVE
Glucose, UA: NEGATIVE mg/dL
Hgb urine dipstick: NEGATIVE
Ketones, ur: NEGATIVE mg/dL
Nitrite: NEGATIVE
Protein, ur: NEGATIVE mg/dL
Specific Gravity, Urine: 1.018 (ref 1.005–1.030)
Urobilinogen, UA: 0.2 mg/dL (ref 0.0–1.0)
pH: 7 (ref 5.0–8.0)

## 2013-05-17 LAB — BASIC METABOLIC PANEL
BUN: 16 mg/dL (ref 6–23)
CO2: 30 mEq/L (ref 19–32)
Calcium: 9.9 mg/dL (ref 8.4–10.5)
Chloride: 103 mEq/L (ref 96–112)
Creatinine, Ser: 0.67 mg/dL (ref 0.50–1.10)
GFR calc Af Amer: >90 mL/min (ref >90)
GFR calc non Af Amer: >90 mL/min (ref >90)
Glucose, Bld: 86 mg/dL (ref 70–99)
Potassium: 4.7 mEq/L (ref 3.5–5.1)
Sodium: 140 mEq/L (ref 135–145)

## 2013-05-17 NOTE — ED Provider Notes (Signed)
Medical screening examination/treatment/procedure(s) were performed by non-physician practitioner and as supervising physician I was immediately available for consultation/collaboration.  EKG Interpretation   None         Gavin Pound. Oletta Lamas, MD 05/17/13 [phone]

## 2013-05-17 NOTE — ED Provider Notes (Signed)
CSN: [id number]     Arrival date & time 05/17/13  1953 History   First MD Initiated Contact with Patient 05/17/13 2135     Chief Complaint  Patient presents with  . Headache  . Arm Pain    left   . Facial Pain    left   (Consider location/radiation/quality/duration/timing/severity/associated sxs/prior Treatment) HPI Patient presents to the emergency department with headache, and tingling in her left face and arm.  Patient, states, that this been ongoing for several months.  Patient, states she has not seen anyone else for the symptoms.  Patient, states she's had nausea.  Patient, states, that nothing seems to make her condition, better or worse.  The patient denies any other medical problems.  Patient, states, that she's not had any chest pain, shortness of breath, weakness, fever, cough, dysuria, abdominal pain, or diarrhea.  The patient, states she has had some mild dizziness at times Past Medical History  Diagnosis Date  . Chicken pox   . Frequent headaches    History reviewed. No pertinent past surgical history. Family History  Problem Relation Age of Onset  . Hypertension Mother   . Diabetes Mother   . Cancer Mother     breast cancer   History  Substance Use Topics  . Smoking status: Former Smoker -- 0.25 packs/day for 4 years    Types: Cigarettes    Quit date: 06/20/2000  . Smokeless tobacco: Never Used  . Alcohol Use: No   OB History   Grav Para Term Preterm Abortions TAB SAB Ect Mult Living   4 3 3  1 1    3      Review of Systems All other systems negative except as documented in the HPI. All pertinent positives and negatives as reviewed in the HPI. Allergies  Review of patient's allergies indicates no known allergies.  Home Medications   Current Outpatient Rx  Name  Route  Sig  Dispense  Refill  . ibuprofen (ADVIL,MOTRIN) 200 MG tablet   Oral   Take 200 mg by mouth every 6 (six) hours as needed for moderate pain.          BP 99/55  Pulse 59   Temp(Src) 97.9 F (36.6 C) (Oral)  Resp 15  Wt 109 lb 8 oz (49.669 kg)  SpO2 100%  Breastfeeding? Yes Physical Exam  Nursing note and vitals reviewed. Constitutional: She is oriented to person, place, and time. She appears well-developed and well-nourished. No distress.  HENT:  Head: Normocephalic and atraumatic.  Mouth/Throat: Oropharynx is clear and moist.  Eyes: Pupils are equal, round, and reactive to light.  Neck: Normal range of motion. Neck supple.  Cardiovascular: Normal rate, regular rhythm and normal heart sounds.  Exam reveals no gallop and no friction rub.   No murmur heard. Pulmonary/Chest: Effort normal and breath sounds normal.  Abdominal: Soft. Bowel sounds are normal. She exhibits no distension. There is no tenderness.  Musculoskeletal: She exhibits no edema.  Neurological: She is alert and oriented to person, place, and time. She has normal strength. A sensory deficit is present. She exhibits normal muscle tone. She displays a negative Romberg sign. Coordination and gait normal. GCS eye subscore is 4. GCS verbal subscore is 5. GCS motor subscore is 6.  Patient has altered sensation in her left face and arm  Skin: Skin is warm and dry. No erythema.    ED Course  Procedures (including critical care time) Labs Review Labs Reviewed  URINALYSIS, ROUTINE W REFLEX  MICROSCOPIC - Abnormal; Notable for the following:    Leukocytes, UA SMALL (*)    All other components within normal limits  URINE MICROSCOPIC-ADD ON - Abnormal; Notable for the following:    Squamous Epithelial / LPF FEW (*)    All other components within normal limits  BASIC METABOLIC PANEL  CBC WITH DIFFERENTIAL  PREGNANCY, URINE   Imaging Review Ct Head Wo Contrast  05/17/2013   CLINICAL DATA:  32 year old female with severe headache, left arm and facial tingling.  EXAM: CT HEAD WITHOUT CONTRAST  TECHNIQUE: Contiguous axial images were obtained from the base of the skull through the vertex without  intravenous contrast.  COMPARISON:  05/05/2010  FINDINGS: No intracranial abnormalities are identified, including mass lesion or mass effect, hydrocephalus, extra-axial fluid collection, midline shift, hemorrhage, or acute infarction.  The visualized bony calvarium is unremarkable.  IMPRESSION: Unremarkable noncontrast head CT   Electronically Signed   By: Laveda Abbe M.D.   On: 05/17/2013 22:27    Patient is most likely given the MRI that her testing here tonight, was negative.  The concerning factors.  She may have a demyelinating-type scenario with these symptoms.  She will be referred to neurology for further evaluation and care.  Patient is advised of her test results.  All questions were answered   Carlyle Dolly, PA-C 05/17/13 2326

## 2013-05-17 NOTE — ED Notes (Signed)
Pt has multiple complaint and has had headaches for several months, Her main concern today is the numbness and tingling in her left arm, Face is tingling and left eye, Pt c/o that the whole left side of her body feels different than the right

## 2013-05-17 NOTE — ED Notes (Signed)
H/o HAs. HAs have gotten much worse. Describes as pressure in back of head. Ongoing for ~ 2 months. Also reports L arm and face tingling. Also feels like something in L ear. Swallowing seem effected. HA not that bad right now, 3/10. Tingling is constant. Face more than arm.

## 2013-11-19 ENCOUNTER — Other Ambulatory Visit: Payer: Self-pay | Admitting: Dermatology

## 2013-11-22 ENCOUNTER — Other Ambulatory Visit: Payer: Self-pay | Admitting: Obstetrics and Gynecology

## 2013-11-26 LAB — CYTOLOGY - PAP

## 2014-04-21 ENCOUNTER — Encounter (HOSPITAL_COMMUNITY): Payer: Self-pay | Admitting: Emergency Medicine

## 2014-09-25 ENCOUNTER — Encounter: Payer: Self-pay | Admitting: Family Medicine

## 2014-09-25 ENCOUNTER — Ambulatory Visit (INDEPENDENT_AMBULATORY_CARE_PROVIDER_SITE_OTHER): Payer: BLUE CROSS/BLUE SHIELD | Admitting: Family Medicine

## 2014-09-25 VITALS — BP 96/70 | HR 99 | Temp 97.9°F | Ht 65.75 in | Wt 111.7 lb

## 2014-09-25 DIAGNOSIS — R519 Headache, unspecified: Secondary | ICD-10-CM

## 2014-09-25 DIAGNOSIS — Z0000 Encounter for general adult medical examination without abnormal findings: Secondary | ICD-10-CM

## 2014-09-25 DIAGNOSIS — R51 Headache: Secondary | ICD-10-CM | POA: Diagnosis not present

## 2014-09-25 LAB — CBC WITH DIFFERENTIAL/PLATELET
Basophils Absolute: 0 10*3/uL (ref 0.0–0.1)
Basophils Relative: 0.8 % (ref 0.0–3.0)
EOS PCT: 1.7 % (ref 0.0–5.0)
Eosinophils Absolute: 0.1 10*3/uL (ref 0.0–0.7)
HCT: 39.5 % (ref 36.0–46.0)
Hemoglobin: 13.4 g/dL (ref 12.0–15.0)
LYMPHS ABS: 1.6 10*3/uL (ref 0.7–4.0)
Lymphocytes Relative: 33.4 % (ref 12.0–46.0)
MCHC: 34 g/dL (ref 30.0–36.0)
MCV: 88.9 fl (ref 78.0–100.0)
MONO ABS: 0.3 10*3/uL (ref 0.1–1.0)
Monocytes Relative: 6.8 % (ref 3.0–12.0)
NEUTROS ABS: 2.8 10*3/uL (ref 1.4–7.7)
Neutrophils Relative %: 57.3 % (ref 43.0–77.0)
PLATELETS: 217 10*3/uL (ref 150.0–400.0)
RBC: 4.45 Mil/uL (ref 3.87–5.11)
RDW: 12.5 % (ref 11.5–15.5)
WBC: 4.8 10*3/uL (ref 4.0–10.5)

## 2014-09-25 LAB — LIPID PANEL
CHOLESTEROL: 182 mg/dL (ref 0–200)
HDL: 70.9 mg/dL (ref >39.00)
LDL CALC: 97 mg/dL (ref 0–99)
NONHDL: 111.1
Total CHOL/HDL Ratio: 3
Triglycerides: 73 mg/dL (ref 0.0–149.0)
VLDL: 14.6 mg/dL (ref 0.0–40.0)

## 2014-09-25 LAB — BASIC METABOLIC PANEL
BUN: 9 mg/dL (ref 6–23)
CHLORIDE: 105 meq/L (ref 96–112)
CO2: 30 meq/L (ref 19–32)
CREATININE: 0.78 mg/dL (ref 0.40–1.20)
Calcium: 9.8 mg/dL (ref 8.4–10.5)
GFR: 89.86 mL/min (ref >60.00)
GLUCOSE: 76 mg/dL (ref 70–99)
Potassium: 4.7 mEq/L (ref 3.5–5.1)
Sodium: 139 mEq/L (ref 135–145)

## 2014-09-25 LAB — TSH: TSH: 0.88 u[IU]/mL (ref 0.35–4.50)

## 2014-09-25 LAB — VITAMIN B12: Vitamin B-12: 449 pg/mL (ref 211–911)

## 2014-09-25 MED ORDER — NORTRIPTYLINE HCL 25 MG PO CAPS: 25.0000 mg | ORAL_CAPSULE | Freq: Every day | ORAL | Status: DC

## 2014-09-25 NOTE — Progress Notes (Signed)
HPI:  Catherine Hall is a 34 yo F, not seen in our office in several years, here for a CPE. Reports feels well for the most part. However, has chronic frequent headaches since she was a teenager. Pain in the L sub occ region and sensitivity in scalp - 4 times per week. Unchanged. Not worsening. Takes ibuprofen and this helps - takes this 2 times week.  She has tingling of the L side of her face with some of her headaches. She has not noticed what aggravates this. She has not ever taken a migraine medication. She has had several normal CT scans for this. Denies changes in headache frequency or severity, vision changes, nausea and vomiting, speech problems, drooping face, weakness. FH migraines in mother. No aura.  -Concerns and/or follow up today: see above  -Diet: variety of foods, balance and well rounded  -Exercise: regular exercise  -Taking folic acid, vitamin D or calcium: no  -Diabetes and Dyslipidemia Screening:   -Hx of HTN: no  -Vaccines: UTD  -pap history: she is on her period and declines pap today as had normal pap 11/2013  -FDLMP: today, on birth control.  -sexual activity: yes, female partner, no new partners  -wants STI testing: no  -Alcohol, Tobacco, drug use: see social history  Review of Systems - no fevers, unintentional weight loss, vision loss, hearing loss, chest pain, sob, hemoptysis, melena, hematochezia, hematuria, genital discharge, changing or concerning skin lesions, bleeding, bruising, loc, thoughts of self harm or SI  Past Medical History  Diagnosis Date  . Chicken pox   . Frequent headaches     No past surgical history on file.  Family History  Problem Relation Age of Onset  . Hypertension Mother   . Diabetes Mother   . Cancer Mother     breast cancer    History   Social History  . Marital Status: Married    Spouse Name: N/A  . Number of Children: N/A  . Years of Education: N/A   Social History Main Topics  . Smoking status: Former  Smoker -- 0.25 packs/day for 4 years    Types: Cigarettes    Quit date: 06/20/2000  . Smokeless tobacco: Never Used  . Alcohol Use: No  . Drug Use: No  . Sexual Activity: Yes   Other Topics Concern  . 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  nortriptyline (PAMELOR) 25 MG capsule, Take 1 capsule (25 mg total) by mouth at bedtime., Disp: 30 capsule, Rfl: 3  EXAM:  Filed Vitals:   09/25/14 0930  BP: 96/70  Pulse: 99  Temp: 97.9 F (36.6 C)    GENERAL: vitals reviewed and listed below, alert, oriented, appears well hydrated and in no acute distress  HEENT: head atraumatic, PERRLA, normal appearance of eyes, ears, nose and mouth. moist mucus membranes.  NECK: supple, no masses or lymphadenopathy  LUNGS: clear to auscultation bilaterally, no rales, rhonchi or wheeze  CV: HRRR, no peripheral edema or cyanosis, normal pedal pulses  BREAST: declined  ABDOMEN: bowel sounds normal, soft, non tender to palpation, no masses, no rebound or guarding  GU: declined  RECTAL: refused  SKIN: no rash or abnormal lesions  MS: normal gait, moves all extremities normally  NEURO: CN II-XII grossly intact, normal muscle strength and sensation to light touch on extremities, finger to nose normal, DTRs all normal, strength throughout normal,   PSYCH: normal affect, pleasant and cooperative  ASSESSMENT AND PLAN:  Discussed the following assessment and plan:   Visit for preventive health examination - Plan: Vitamin B12, TSH, CBC with Differential, Lipid Panel, Basic metabolic panel  Bilateral headaches - Plan: nortriptyline (PAMELOR) 25 MG  capsule -we discussed possible serious and likely etiologies, workup and treatment, treatment risks and return precautions -sig discussion, exam and decision making beyond physical with new medication prescribed -advised MRI, labs and query migraine - atypical -she opted for labs and trial migraine prophylactic medication, declined MRI -of course, we advised Aida  to return or notify a doctor immediately if symptoms worsen or persist or new concerns arise.  -Discussed and advised all [place] preventive services health task force level A and B recommendations for age, sex and risks.  -Advised at least 150 minutes of exercise per week and a healthy diet low in saturated fats and sweets and consisting of fresh fruits and vegetables, lean meats such as fish and white chicken and whole grains.  -FASTING labs, studies and vaccines per orders this encounter  Orders Placed This Encounter  Procedures  . Vitamin B12  . TSH  . CBC with Differential  . Lipid Panel  . Basic metabolic panel    Patient advised to return to clinic immediately if symptoms worsen or persist or new concerns.  Patient Instructions  BEFORE YOU LEAVE: -follow up appointment in 1 month  Start the nortriptyline 1 tablet before bed daily  Keep a headache journal  -We have ordered labs or studies at this visit. It can take up to 1-2 weeks for results and processing. We will contact you with instructions IF your results are abnormal. Normal results will be released to your [facility]. If you have not heard from [place] or can not find your results in [facility] in 2 weeks please contact our office.  We recommend the following healthy lifestyle measures: - eat a healthy diet consisting of lots of vegetables, fruits, beans, nuts, seeds, healthy meats such as white chicken and fish and whole grains.  - avoid fried foods, fast food, processed foods, sodas, red meet and other fattening foods.  - get a least 150 minutes of aerobic exercise  per week.           No Follow-up on file.  Kriste Basque R.

## 2014-09-25 NOTE — Progress Notes (Signed)
Pre visit review using our clinic review tool, if applicable. No additional management support is needed unless otherwise documented below in the visit note. 

## 2014-09-25 NOTE — Patient Instructions (Signed)
BEFORE YOU LEAVE: -follow up appointment in 1 month  Start the nortriptyline 1 tablet before bed daily  Keep a headache journal  -We have ordered labs or studies at this visit. It can take up to 1-2 weeks for results and processing. We will contact you with instructions IF your results are abnormal. Normal results will be [facility] to your [facility]. If you have not heard from us or can not find your results in [facility] in 2 weeks please contact our office.  We recommend the following healthy lifestyle measures: - eat a healthy diet consisting of lots of vegetables, fruits, beans, nuts, seeds, healthy meats such as white chicken and fish and whole grains.  - avoid fried foods, fast food, processed foods, sodas, red meet and other fattening foods.  - get a least 150 minutes of aerobic exercise per week.

## 2014-11-06 ENCOUNTER — Ambulatory Visit (INDEPENDENT_AMBULATORY_CARE_PROVIDER_SITE_OTHER): Payer: BLUE CROSS/BLUE SHIELD | Admitting: Family Medicine

## 2014-11-06 ENCOUNTER — Encounter: Payer: Self-pay | Admitting: Family Medicine

## 2014-11-06 ENCOUNTER — Telehealth: Payer: Self-pay | Admitting: Family Medicine

## 2014-11-06 VITALS — BP 98/58 | HR 116 | Temp 98.6°F | Ht 65.75 in | Wt 111.8 lb

## 2014-11-06 DIAGNOSIS — R519 Headache, unspecified: Secondary | ICD-10-CM

## 2014-11-06 DIAGNOSIS — R202 Paresthesia of skin: Secondary | ICD-10-CM | POA: Diagnosis not present

## 2014-11-06 DIAGNOSIS — R413 Other amnesia: Secondary | ICD-10-CM

## 2014-11-06 DIAGNOSIS — R51 Headache: Principal | ICD-10-CM

## 2014-11-06 DIAGNOSIS — G8929 Other chronic pain: Secondary | ICD-10-CM

## 2014-11-06 NOTE — Progress Notes (Signed)
HPI:  Headaches: -since she was a teenager -4 days per week, L sided with L face tingling occ -started nortriptyline 09/2014 (labs normal), hx several CT scans normal in the past for same -reports: improvement is remarkable in the headaches - reports no headaches, resolution of tingling as well -has had a few spells of tingling sensation in L face, eye, arm the last week that last for 1 day without headaches -also reports new issues with memory the last few months that she forgot to mention at her last visit (short term recall of events and numbers, chart notes) -denies: weakness,  vision changes (sometimes mild focus issue in eyes), speech changes ROS: See pertinent positives and negatives per HPI.  Past Medical History  Diagnosis Date  . Chicken pox   . Frequent headaches     No past surgical history on file.  Family History  Problem Relation Age of Onset  . Hypertension Mother   . Diabetes Mother   . Cancer Mother     breast cancer    History   Social History  . Marital Status: Married    Spouse Name: N/A  . Number of Children: N/A  . Years of Education: N/A   Social History Main Topics  . Smoking status: Former Smoker -- 0.25 packs/day for 4 years    Types: Cigarettes    Quit date: 06/20/2000  . Smokeless tobacco: Never Used  . Alcohol Use: No  . Drug Use: No  . Sexual Activity: Yes   Other Topics Concern  . 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  nortriptyline (PAMELOR) 25 MG capsule, Take 1 capsule (25 mg total) by mouth at bedtime., Disp: 30 capsule, Rfl: 3 .  SPRINTEC 28 0.25-35 MG-MCG tablet, Take 1 tablet by mouth  daily., Disp: , Rfl: 10  EXAM:  Filed Vitals:   11/06/14 0802  BP: 98/58  Pulse: 116  Temp: 98.6 F (37 C)    Body mass index is 18.18 kg/(m^2).  GENERAL: vitals reviewed and listed above, alert, oriented, appears well hydrated and in no acute distress  HEENT: atraumatic, PERRLA, conjunttiva clear, no obvious abnormalities on inspection of external nose and ears  NECK: no obvious masses on inspection  LUNGS: clear to auscultation bilaterally, no wheezes, rales or rhonchi, good air movement  CV: HRRR, no peripheral edema  MS: moves all extremities without noticeable abnormality  PSYCH: pleasant and cooperative, no obvious depression or anxiety  NEURO: CN II-XII grossly intact, finger to nose normal, gait normal, speech and thought processing grossly intact  ASSESSMENT AND PLAN:  Discussed the following assessment and plan:  Paresthesia - Plan: Ambulatory referral to Neurology Memory loss - Plan: Ambulatory referral to Neurology -benign exam, may be linked with her migraines/atypical features as has been going on for several years, neg CT scan x2, responded somewhat to Pamelor -advise MRI at minimum versus eval with neurologist - she opted for eval with neurologist, referral placed  Chronic nonintractable headache, unspecified headache type -resolved on medication -cont medication  -  Patient advised to return or notify a doctor immediately if symptoms worsen or persist or new concerns arise.  Patient Instructions  BEFORE YOU LEAVE: -follow up in 3-4 months  Continue medication  Keep journal of symptoms  -We placed a referral for you as discussed to the neurologist regarding the tingling in the face and arm and the memory issues. It usually takes about 1-2 weeks to process and schedule this referral. If you have not heard from us regarding this appointment in 2 weeks please contact our office.      Kriste BasqueKIM, HANNAH R.

## 2014-11-06 NOTE — Patient Instructions (Signed)
BEFORE YOU LEAVE: -follow up in 3-4 months  Continue medication  Keep journal of symptoms  -We placed a referral for you as discussed to the neurologist regarding the tingling in the face and arm and the memory issues. It usually takes about 1-2 weeks to process and schedule this referral. If you have not heard from us regarding this appointment in 2 weeks please contact our office.

## 2014-11-06 NOTE — Telephone Encounter (Signed)
Pt was seen today and would like to proceed with MRI . Pt will wait for neurologist referral also

## 2014-11-06 NOTE — Telephone Encounter (Signed)
Ok to order MRI with and without, but advise keep eval with neurologist as well.

## 2014-11-06 NOTE — Progress Notes (Signed)
Pre visit review using our clinic review tool, if applicable. No additional management support is needed unless otherwise documented below in the visit note. 

## 2014-11-06 NOTE — Telephone Encounter (Signed)
Left a message at the pts home number the order was placed and someone will give her a call with appt info.  Also left a message for her to keep appt with neuro as well.

## 2014-11-13 ENCOUNTER — Ambulatory Visit
Admission: RE | Admit: 2014-11-13 | Discharge: 2014-11-13 | Disposition: A | Discharge status: Home / Self Care | Payer: BLUE CROSS/BLUE SHIELD | Source: Ambulatory Visit | Attending: Family Medicine | Admitting: Family Medicine

## 2014-11-13 DIAGNOSIS — R51 Headache: Principal | ICD-10-CM

## 2014-11-13 DIAGNOSIS — R519 Headache, unspecified: Secondary | ICD-10-CM

## 2014-11-13 MED ORDER — GADOBENATE DIMEGLUMINE 529 MG/ML IV SOLN
10.0000 mL | Freq: Once | INTRAVENOUS | Status: AC | PRN
  Administered 2014-11-13: 10 mL via INTRAVENOUS

## 2015-01-15 ENCOUNTER — Ambulatory Visit: Payer: BLUE CROSS/BLUE SHIELD | Admitting: Neurology

## 2015-02-03 ENCOUNTER — Other Ambulatory Visit: Payer: Self-pay

## 2015-02-04 LAB — CYTOLOGY - PAP

## 2015-02-17 ENCOUNTER — Other Ambulatory Visit: Payer: Self-pay | Admitting: Obstetrics and Gynecology

## 2015-06-03 IMAGING — CR DG CHEST 2V
2 series · 2 of 2 positions shown · non-contrast
Comparison: Chest CT 12/02/2012

CLINICAL DATA: Chest pain and shortness of breath.

CHEST - 2 VIEW

[w chest pa]
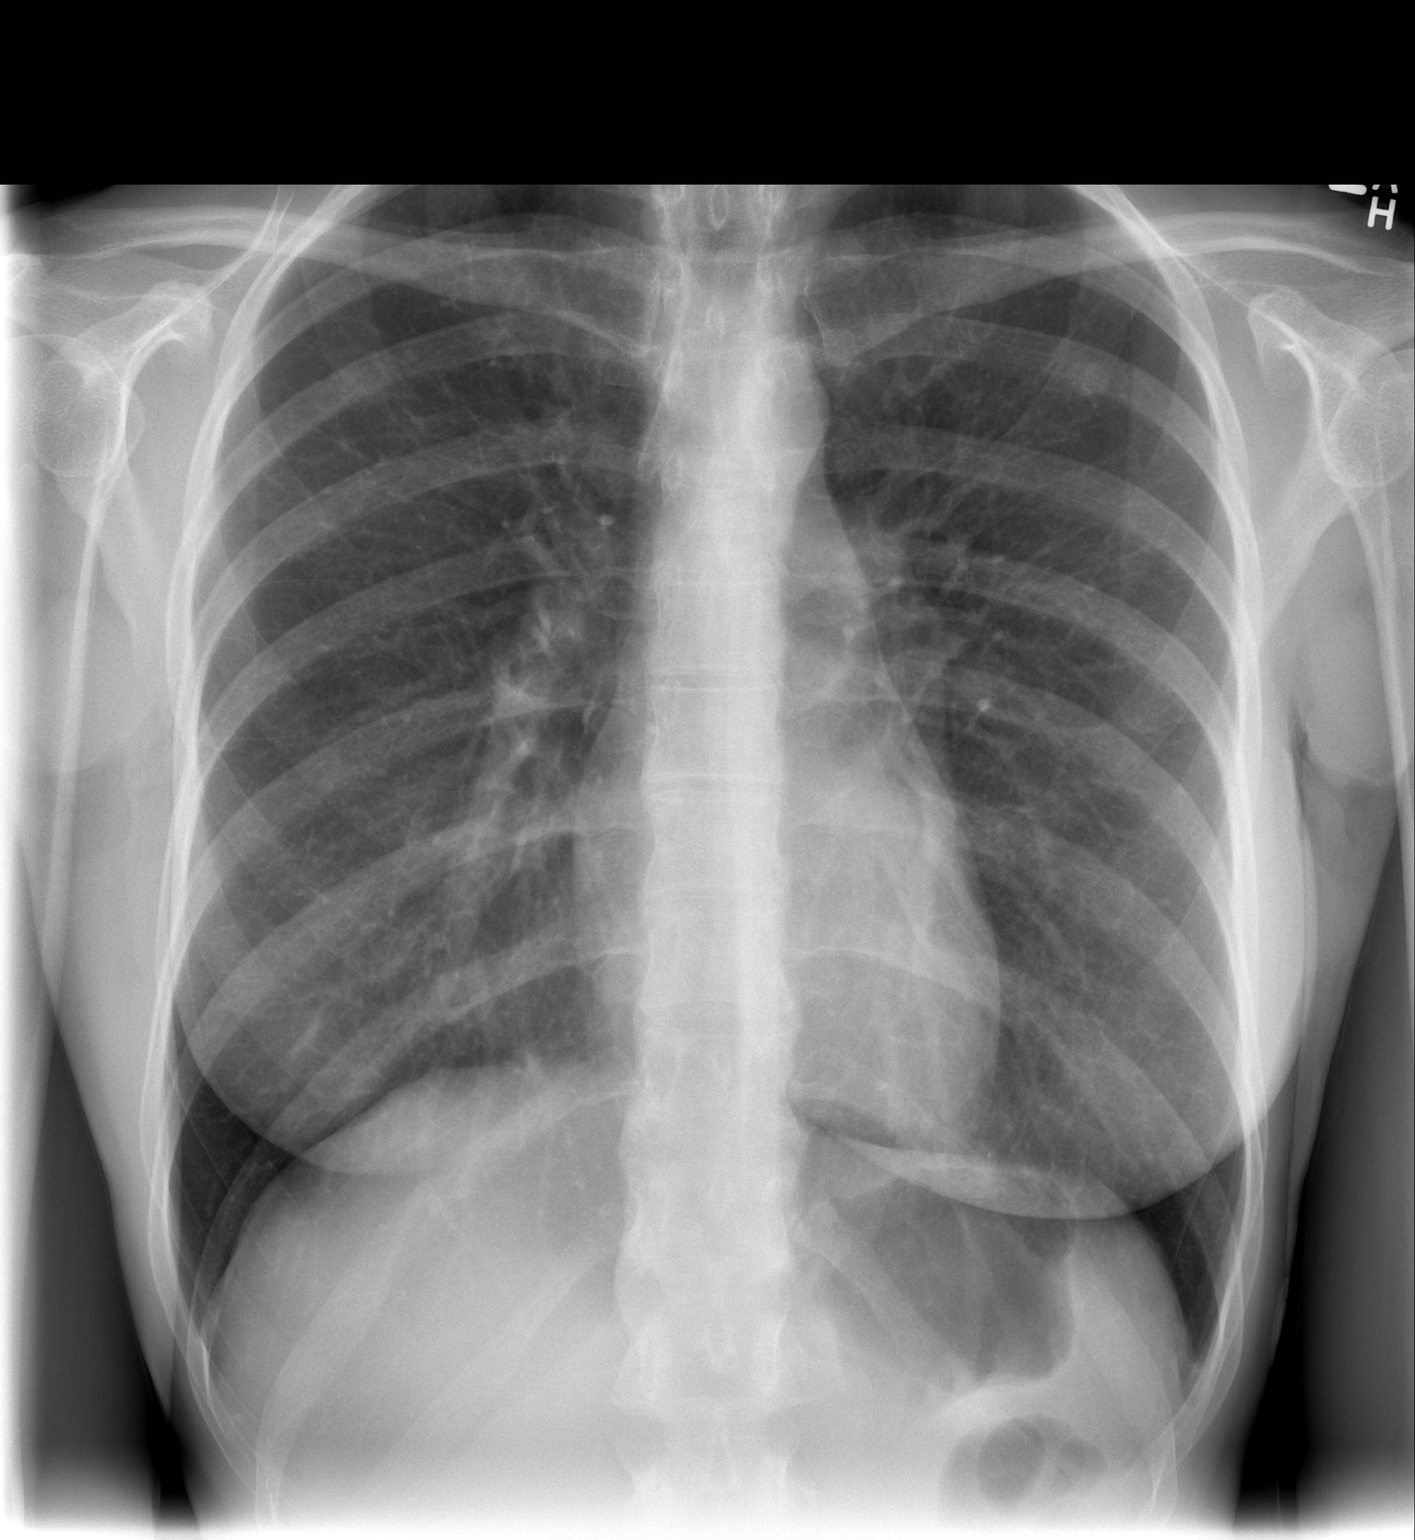

[w chest lat]
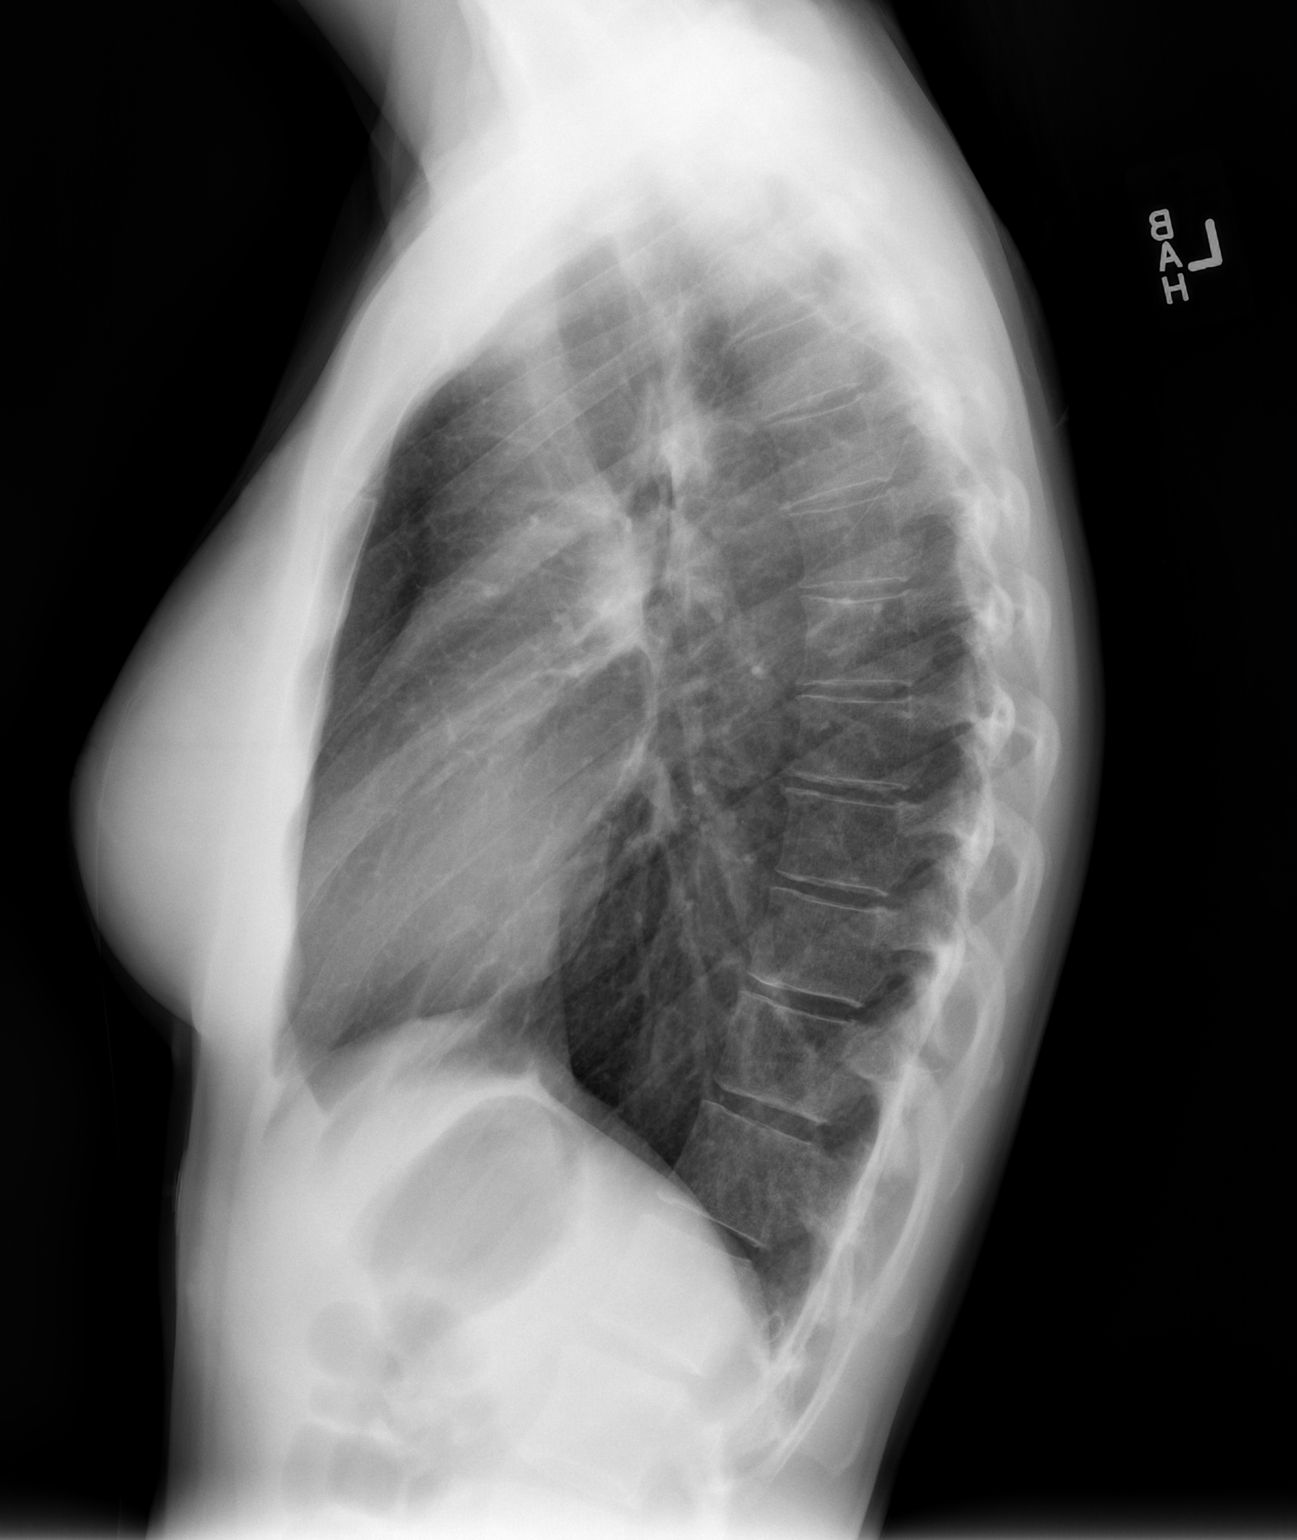

[2 of 2 positions shown; findings below may reference images not displayed]

FINDINGS: Two views of the chest demonstrate clear lungs.  There
appears to be a 1 cm nodular density overlying the left second and
left fifth ribs.  This probably represents overlying shadows
because there was no suspicious lung lesion on the recent chest CT.
Heart and mediastinum are within normal limits.  Bony thorax is
intact.
IMPRESSION: No acute cardiopulmonary disease.

## 2015-08-03 ENCOUNTER — Encounter: Payer: Self-pay | Admitting: Family Medicine

## 2015-08-03 ENCOUNTER — Ambulatory Visit (INDEPENDENT_AMBULATORY_CARE_PROVIDER_SITE_OTHER): Payer: 59 | Admitting: Family Medicine

## 2015-08-03 VITALS — BP 90/68 | HR 89 | Temp 97.9°F | Ht 65.75 in | Wt 112.2 lb

## 2015-08-03 DIAGNOSIS — R519 Headache, unspecified: Secondary | ICD-10-CM

## 2015-08-03 DIAGNOSIS — R202 Paresthesia of skin: Secondary | ICD-10-CM

## 2015-08-03 DIAGNOSIS — R51 Headache: Secondary | ICD-10-CM

## 2015-08-03 DIAGNOSIS — R209 Unspecified disturbances of skin sensation: Secondary | ICD-10-CM | POA: Diagnosis not present

## 2015-08-03 NOTE — Patient Instructions (Signed)
-  We placed a referral for you as discussed to the neurologist. It usually takes about 1-2 weeks to process and schedule this referral. If you have not heard from us regarding this appointment in 2 weeks please contact our office.   

## 2015-08-03 NOTE — Progress Notes (Signed)
Pre visit review using our clinic review tool, if applicable. No additional management support is needed unless otherwise documented below in the visit note. 

## 2015-08-03 NOTE — Progress Notes (Signed)
HPI:  Catherine Hall is a 35 yo F with a PMH significant for longstanding migraines and intermittent L sided facial and L upper extremity paresthesias. She has had several CT scans (2011 and 2014), an MRI and labs last year, and I referred her to neurology last year for this and reported memory concerns. Nortriptyline helped both concerns in the past. She reports she never saw the neurologist and stopped the nortriptyline because she felt it stopped working. She has persistent intermittent L sided temporofrontal moderate headaches sometimes accompanied by intermittent L facial paresthesias involving the entire L side of the face, tongue and ear and sometimes the upper L arm. Sometimes the paresthesias occur without the headaches. She sees optho for floaters. Headaches and/or paresthesias occur about 5/7 days per week. Denies: fevers, malaise, weight loss, weakness, vision changes, loss of taste, loss of hearing, nausea, vomiting,  ROS: See pertinent positives and negatives per HPI.  Past Medical History  Diagnosis Date  . Chicken pox   . Frequent headaches     No past surgical history on file.  Family History  Problem Relation Age of Onset  . Hypertension Mother   . Diabetes Mother   . Cancer Mother     breast cancer    Social History   Social History  . Marital Status: Married    Spouse Name: N/A  . Number of Children: N/A  . Years of Education: N/A   Social History Main Topics  . Smoking status: Former Smoker -- 0.25 packs/day for 4 years    Types: Cigarettes    Quit date: 06/20/2000  . Smokeless tobacco: Never Used  . Alcohol Use: No  . Drug Use: No  . Sexual Activity: Yes   Other Topics Concern  . 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  SPRINTEC 28 0.25-35 MG-MCG tablet, Take 1 tablet by mouth daily., Disp: , Rfl: 10  EXAM:  Filed Vitals:   08/03/15 1542  BP: 90/68  Pulse: 89  Temp: 97.9 F (36.6 C)    Body mass index is 18.25 kg/(m^2).  GENERAL: vitals reviewed and listed above, alert, oriented, appears well hydrated and in no acute distress  HEENT: atraumatic, conjunttiva clear, no obvious abnormalities on inspection of external nose and ears, normal appearance of ear canals and TMs, clear nasal congestion, mild post oropharyngeal erythema with PND, no tonsillar edema or exudate, no sinus TTP  NECK: no obvious masses on inspection  LUNGS: clear to auscultation bilaterally, no wheezes, rales or rhonchi, good air movement  CV: HRRR, no peripheral edema  MS: moves all extremities without noticeable abnormality  PSYCH: pleasant and cooperative, no obvious depression or anxiety, gait normal, speech and thought processing grossly intact, CN II-XII grossly intact, finger to nose normal.  ASSESSMENT AND PLAN:  Discussed the following assessment and plan:  Facial paresthesia - Plan: Ambulatory Referral to Neuro Rehab  Frequent headaches - Plan: Ambulatory Referral to Neuro Rehab  -we discussed possible serious and likely etiologies, workup and treatment, treatment risks and  return precautions, I think an atypical migraine patter is possible, but again advised evaluation with neurology for further eval and treatment - she agreed and referral was placed.  -of course, we advised Timiya  to return or notify a doctor immediately if symptoms worsen or persist or new concerns arise.   -Patient advised to return or notify a doctor immediately if symptoms worsen or persist or new concerns arise.  Patient Instructions  -We placed a referral for you as discussed to the neurologist. It usually takes about 1-2  weeks to process and schedule this referral. If you have not heard from [place] regarding this appointment in 2 weeks please contact our office.      Kriste Basque R.

## 2015-08-10 ENCOUNTER — Ambulatory Visit: Payer: No Typology Code available for payment source | Admitting: Rehabilitation

## 2015-08-10 ENCOUNTER — Telehealth: Payer: Self-pay | Admitting: Family Medicine

## 2015-08-10 ENCOUNTER — Other Ambulatory Visit: Payer: Self-pay | Admitting: Family Medicine

## 2015-08-10 DIAGNOSIS — R51 Headache: Secondary | ICD-10-CM

## 2015-08-10 DIAGNOSIS — R202 Paresthesia of skin: Secondary | ICD-10-CM

## 2015-08-10 DIAGNOSIS — R519 Headache, unspecified: Secondary | ICD-10-CM

## 2015-08-10 NOTE — Telephone Encounter (Signed)
I left a detailed message with the information below at the pts cell number. 

## 2015-08-10 NOTE — Telephone Encounter (Signed)
Please let her know I found out about this this morning as was out of the office last Friday and did resend referral. Thank you.

## 2015-08-10 NOTE — Telephone Encounter (Signed)
Patient stated that Dr Selena Batten referred her to [facility] office called her saying she was referred to the wrong office, please advise

## 2015-09-14 ENCOUNTER — Encounter: Payer: Self-pay | Admitting: Neurology

## 2015-09-14 ENCOUNTER — Ambulatory Visit (INDEPENDENT_AMBULATORY_CARE_PROVIDER_SITE_OTHER): Payer: 59 | Admitting: Neurology

## 2015-09-14 VITALS — BP 110/68 | HR 68 | Ht 65.75 in | Wt 115.3 lb

## 2015-09-14 DIAGNOSIS — R51 Headache: Secondary | ICD-10-CM

## 2015-09-14 DIAGNOSIS — G43809 Other migraine, not intractable, without status migrainosus: Secondary | ICD-10-CM | POA: Diagnosis not present

## 2015-09-14 DIAGNOSIS — R519 Headache, unspecified: Secondary | ICD-10-CM

## 2015-09-14 NOTE — Progress Notes (Signed)
[facility] Neu[facility] Clinic Note - Initial Visit   Date: 09/14/2015  Catherine Hall MRN: [id number] DOB: June 19, 1981   Dear Dr. Selena Batten:  Thank you for your kind referral of Catherine Hall for consultation of facial paresthesias. Although her history is well known to you, please allow [place] to reiterate it for the purpose of our medical record. The patient was accompanied to the clinic by self.    History of Present Illness: Catherine Hall is a 35 y.o. right-handed Caucasian female presenting for evaluation of chronic headaches and left facial paresthesias and headaches.  She has a long history of headaches which started in her teen years.  Headaches are variable in location (retroorbital, holocephalic) and initially were less intense and intermittent.  She endorses photophobia and phonophobia.  No nausea or vomiting.  Since 2015, she began having left temporal sharp pain and left facial numbness which has increased in intensity and occuring about 5 days per week, lasting all day.  No identifiable triggers.  Left facial numbness can occur with or without a headache.  Sometimes, it involves the entire left face or scalp.  She takes ibuprofen about 3-4 times per week, which helps her headaches but does not improve her paresthesias.   She was given nortriptyline in April 2016, but felt that it made her feel "strange" so stopped it.  She feels as if she is always in a fog and half of her brain is numb.  Previous MRI brain from 2016 has been unremarkable.  She has floaters and saw her eye care provider.     Her sister and mother have history of migraines.   Out-side paper records, electronic medical record, and images have been reviewed where available and summarized as:  MRI brain wwo contrast 11/13/2014:  No acute abnormality.  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 CT head wo contrast 05/13/2013:  unremarkable Labs 09/25/2014:  vitamin B12 449,   TSH 0.88  Past Medical History  Diagnosis Date  . Chicken pox   . Frequent headaches     No past surgical history on file.   Medications:  Outpatient Encounter Prescriptions as of 09/14/2015  Medication Sig Note  . SPRINTEC 28 0.25-35 MG-MCG tablet Take 1 tablet by mouth daily. 11/06/2014: Received from: External Pharmacy   No facility-administered encounter medications on file as of 09/14/2015.     Allergies: No Known Allergies  Family History: Family History  Problem Relation Age of Onset  . Hypertension Mother   . Diabetes Mother   . Cancer Mother     breast cancer    Social History: Social History  Substance Use Topics  . Smoking status: Former Smoker -- 0.25 packs/day for 4 years    Types: Cigarettes    Quit date: 06/20/2000  . Smokeless tobacco: Never Used  . 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      Beliefs: Christian      Lifestyle: no regular exercise; diet is healthy - feels like has been underweight her whole life - eat well      Education: some college.             Review of Systems:  CONSTITUTIONAL: No fevers, chills, night sweats, or weight loss.   EYES: No visual changes or  eye pain ENT: No hearing changes.  No history of nose bleeds.   RESPIRATORY: No cough, wheezing and shortness of breath.   CARDIOVASCULAR: Negative for chest pain, and palpitations.   GI: Negative for abdominal discomfort, blood in stools or black stools.  No recent change in bowel habits.   GU:  No history of incontinence.   MUSCLOSKELETAL: No history of joint pain or swelling.  No myalgias.   SKIN: Negative for lesions, rash, and itching.   HEMATOLOGY/ONCOLOGY: Negative for prolonged bleeding, bruising easily, and swollen nodes.  No history of cancer.   ENDOCRINE: Negative for cold or heat intolerance,  polydipsia or goiter.   PSYCH:  No depression or anxiety symptoms.   NEURO: As Above.   Vital Signs:  BP 110/68 mmHg  Pulse 68  Ht 5' 5.75" (1.67 m)  Wt 115 lb 5 oz (52.305 kg)  BMI 18.75 kg/m2 Pain Scale: 0 on a scale of 0-10   General Medical Exam:   General:  Well appearing, comfortable.   Eyes/ENT: see cranial nerve examination.   Neck: No masses appreciated.  Full range of motion without tenderness.  No carotid bruits. Respiratory:  Clear to auscultation, good air entry bilaterally.   Cardiac:  Regular rate and rhythm, no murmur.   Extremities:  No deformities, edema, or skin discoloration.  Skin:  No rashes or lesions.  Neurological Exam: MENTAL STATUS including orientation to time, place, person, recent and remote memory, attention span and concentration, language, and fund of knowledge is normal.  Speech is not dysarthric.  CRANIAL NERVES: II:  No visual field defects.  Unremarkable fundi.   III-IV-VI: Pupils equal round and reactive to light.  Normal conjugate, extra-ocular eye movements in all directions of gaze.  No nystagmus.  No ptosis.   V:  Normal facial sensation to all modalities.  Jaw jerk is absent.   VII:  Normal facial symmetry and movements.  No pathologic facial reflexes.  VIII:  Normal hearing and vestibular function.   IX-X:  Normal palatal movement.   XI:  Normal shoulder shrug and head rotation.   XII:  Normal tongue strength and range of motion, no deviation or fasciculation.  MOTOR:  No atrophy, fasciculations or abnormal movements.  No pronator drift.  Tone is normal.    Right Upper Extremity:    Left Upper Extremity:    Deltoid  5/5   Deltoid  5/5   Biceps  5/5   Biceps  5/5   Triceps  5/5   Triceps  5/5   Wrist extensors  5/5   Wrist extensors  5/5   Wrist flexors  5/5   Wrist flexors  5/5   Finger extensors  5/5   Finger extensors  5/5   Finger flexors  5/5   Finger flexors  5/5   Dorsal interossei  5/5   Dorsal interossei  5/5   Abductor  pollicis  5/5   Abductor pollicis  5/5   Tone (Ashworth scale)  0  Tone (Ashworth scale)  0   Right Lower Extremity:    Left Lower Extremity:    Hip flexors  5/5   Hip flexors  5/5   Hip extensors  5/5   Hip extensors  5/5   Knee flexors  5/5   Knee flexors  5/5   Knee extensors  5/5   Knee extensors  5/5   Dorsiflexors  5/5   Dorsiflexors  5/5   Plantarflexors  5/5   Plantarflexors  5/5  Toe extensors  5/5   Toe extensors  5/5   Toe flexors  5/5   Toe flexors  5/5   Tone (Ashworth scale)  0  Tone (Ashworth scale)  0   MSRs:  Right                                                                 Left brachioradialis 2+  brachioradialis 2+  biceps 2+  biceps 2+  triceps 2+  triceps 2+  patellar 2+  patellar 2+  ankle jerk 2+  ankle jerk 2+  Hoffman no  Hoffman no  plantar response down  plantar response down   SENSORY:  Normal and symmetric perception of light touch, pinprick, vibration, and proprioception.  Romberg's sign absent.   COORDINATION/GAIT: Normal finger-to- nose-finger and heel-to-shin.  Intact rapid alternating movements bilaterally.  Able to rise from a chair without using arms.  Gait narrow based and stable. Tandem and stressed gait intact.    IMPRESSION: 1.  Migraine variant manifesting with left facial paresthesias.  MRI brain personally reviewed which does not show any thing worrisome.  With her normal exam and lack of red flags on her history, low suspicion for vascular abnormalities, so we decided not to pursue angiogram of the head and neck.   2.  Chronic daily headaches.  I offered pharmacological options to treat symptoms with daily preventative medication, however, she does not wish to be on daily medication.  She can try magnesium oxide 400-600mg  daily which can help chronic headaches.  To prevent rebound headaches, she was advised to limit all rescue headache medications (tylenol, ibuprofen, Aleve, etc) to twice per week.  Return to clinic as needed   The  duration of this appointment visit was 50 minutes of face-to-face time with the patient.  Greater than 50% of this time was spent in counseling, explanation of diagnosis, planning of further management, and coordination of care.   Thank you for allowing me to participate in patient's care.  If I can answer any additional questions, I would be pleased to do so.    Sincerely,    Kissy Cielo K. Allena KatzPatel, DO

## 2015-09-14 NOTE — Patient Instructions (Signed)
1.  You can try magnesium oxide 400-600mg  daily for headaches 2.  Limit all headache rescue medications (ibuprofen, Aleve, tylenol) to twice per week  Return to clinic as needed

## 2016-03-16 NOTE — Progress Notes (Signed)
HPI:  Here for CPE:  -Concerns and/or follow up today:   R breast lump: -noticed the last 2 weeks, feels other breast is a little lumpy too -sees gyn for breast and women's health but would like us to evaluate this today -hx breast cyst/biopsy -FH breast Ca in mother -denies: pain, nipple discharge, skin changes -FDLMP: 2 weeks ago  -Diet: variety of foods, balance and well rounded  -Exercise: regular exercise  -Taking folic acid, vitamin D or calcium: no  -Diabetes and Dyslipidemia Screening: wants to repeat  -Hx of HTN: no  -Vaccines: refuses flu vaccine  -pap history: sees gyn and reports had pap 1-2 months ago, Rogelia BogaMichele Grewal, hx abnormal pap and leep  -FDLMP: 2 weeks ago  -sexual activity: yes, female partner, no new partners - OCP is sprintec  -wants STI testing (Hep C if born [id number]): no  -Alcohol, Tobacco, drug use: see social history  Review of Systems - no fevers, unintentional weight loss, vision loss, hearing loss, chest pain, sob, hemoptysis, melena, hematochezia, hematuria, genital discharge, changing or concerning skin lesions, bleeding, bruising, loc, thoughts of self harm or SI  Past Medical History:  Diagnosis Date  . Chicken pox   . Frequent headaches     Past Surgical History:  Procedure Laterality Date  . None      Family History  Problem Relation Age of Onset  . Hypertension Mother   . Diabetes Mother   . Breast cancer Mother   . Hypothyroidism Mother   . Healthy Father   . Healthy Sister   . Healthy Daughter   . Healthy Son   . Brain cancer Maternal Grandmother   . Dementia Paternal Grandfather     Social History   Social History  . Marital status: Married    Spouse name: N/A  . Number of children: N/A  . Years of education: N/A   Social History Main Topics  . Smoking status: Former Smoker    Packs/day: 0.25    Years: 4.00    Types: Cigarettes    Quit date: 06/20/2000  . Smokeless tobacco: Never Used  . Alcohol use No   . Drug use: No  . Sexual activity: Yes   Other Topics Concern  . 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      Beliefs: Christian      Lifestyle: no regular exercise; diet is healthy - feels like has been underweight her whole life - eat well      Education: some college.              Current Outpatient Prescriptions:  .  SPRINTEC 28 0.25-35 MG-MCG tablet, Take 1 tablet by mouth daily., Disp: , Rfl: 10  EXAM:  Vitals:   03/17/16 0701  BP: (!) 80/58  Pulse: 85  Temp: 98.1 F (36.7 C)    GENERAL: vitals reviewed and listed below, alert, oriented, appears well hydrated and in no acute distress  HEENT: head atraumatic, PERRLA, normal appearance of eyes, ears, nose and mouth. moist mucus membranes.  NECK: supple, no masses or lymphadenopathy  LUNGS: clear to auscultation bilaterally, no rales, rhonchi or wheeze  CV: HRRR, no peripheral edema or cyanosis, normal pedal pulses  BREAST: normal appearance - somewhat lumpy breasts in general; 1 cm mobile subcut mass R breast areolar line at  8'ocloc; R breast small irr density areolar line 1'oclock; scattered density L breast particularly in upper medial quadrant; no skin changes or nipple discharge  ABDOMEN: bowel sounds normal, soft, non tender to palpation, no masses, no rebound or guarding  GU: declined, does with gyn  SKIN: no rash or abnormal lesions  MS: normal gait, moves all extremities normally  NEURO: normal gait, speech and thought processing grossly intact, muscle tone grossly intact throughout  PSYCH: normal affect, pleasant and cooperative  ASSESSMENT AND PLAN:  Discussed the following assessment and plan:  Encounter for preventive health examination - Plan: Lipid Panel, Hemoglobin A1c  Breast lump in female - Plan: MM Digital Diagnostic Bilat  -Discussed and advised all  [place] preventive services health task force level A and B recommendations for age, sex and risks.  -Advised at least 150 minutes of exercise per week and a healthy diet with avoidance of (less then 1 serving per week) processed foods, white starches, red meat, fast foods and sweets and consisting of: * 5-9 servings of fresh fruits and vegetables (not corn or potatoes) *nuts and seeds, beans *olives and olive oil *lean meats such as fish and white chicken  *whole grains  -FASTING labs, studies and vaccines per orders this encounter  Orders Placed This Encounter  Procedures  . MM Digital Diagnostic Bilat    Standing Status:   Future    Standing Expiration Date:   05/17/2017    Order Specific Question:   Reason for Exam (SYMPTOM  OR DIAGNOSIS REQUIRED)    Answer:   breast lump, hx breast ca mother, R breast areolar line 8'oclock and 1'oclock; L breast medial upper quadrant scattered density    Order Specific Question:   Is the patient pregnant?    Answer:   No    Order Specific Question:   Preferred imaging location?    Answer:   [facility]  . Lipid Panel  . Hemoglobin A1c    Patient advised to return to clinic immediately if symptoms worsen or persist or new concerns.  Patient Instructions  BEFORE YOU LEAVE: -follow up: yearly and as needed -labs  -We placed a referral for you as discussed to the breast center. It usually takes about 1-2 weeks to process and schedule this referral. If you have not heard from [place] regarding this appointment in 2 weeks please contact our office.  We have ordered labs or studies at this visit. It can take up to 1-2 weeks for results and processing. IF results require follow up or explanation, we will call you with instructions. Clinically stable results will be released to your [facility]. If you have not heard from [place] or cannot find your results in [facility] in 2 weeks please contact our office at [phone].  If you are not yet signed up for [facility],  please consider signing up.   We recommend the following healthy lifestyle for LIFE: 1) Small portions.   Tip: eat off of a salad plate instead of a dinner plate.  Tip: It is ok to feel hungry after a meal - that likely means you ate an appropriate portion.  Tip: if you need more or a snack choose fruits, veggies and/or a handful of nuts or seeds.  2) Eat a healthy clean diet.  * Tip: Avoid (less then 1 serving per week): processed foods, sweets, sweetened drinks, white starches (rice, flour, bread, potatoes, pasta, etc), red meat, fast foods, butter  *Tip: CHOOSE instead   * 5-9 servings per  day of fresh or frozen fruits and vegetables (but not corn, potatoes, bananas, canned or dried fruit)   *nuts and seeds, beans   *olives and olive oil   *small portions of lean meats such as fish and white chicken    *small portions of whole grains  3)Get at least 150 minutes of sweaty aerobic exercise per week.  4)Reduce stress - consider counseling, meditation and relaxation to balance other aspects of your life.           No Follow-up on file.  Kriste Basque R., DO

## 2016-03-17 ENCOUNTER — Encounter: Payer: Self-pay | Admitting: Family Medicine

## 2016-03-17 ENCOUNTER — Ambulatory Visit (INDEPENDENT_AMBULATORY_CARE_PROVIDER_SITE_OTHER): Payer: 59 | Admitting: Family Medicine

## 2016-03-17 VITALS — BP 80/58 | HR 85 | Temp 98.1°F | Ht 66.5 in | Wt 110.5 lb

## 2016-03-17 DIAGNOSIS — Z0000 Encounter for general adult medical examination without abnormal findings: Secondary | ICD-10-CM

## 2016-03-17 DIAGNOSIS — N63 Unspecified lump in unspecified breast: Secondary | ICD-10-CM

## 2016-03-17 LAB — LIPID PANEL
Cholesterol: 162 mg/dL (ref 0–200)
HDL: 70.1 mg/dL (ref >39.00)
LDL CALC: 73 mg/dL (ref 0–99)
NonHDL: 91.42
TRIGLYCERIDES: 93 mg/dL (ref 0.0–149.0)
Total CHOL/HDL Ratio: 2
VLDL: 18.6 mg/dL (ref 0.0–40.0)

## 2016-03-17 LAB — HEMOGLOBIN A1C: Hgb A1c MFr Bld: 5.3 % (ref 4.6–6.5)

## 2016-03-17 NOTE — Progress Notes (Signed)
Pre visit review using our clinic review tool, if applicable. No additional management support is needed unless otherwise documented below in the visit note. 

## 2016-03-17 NOTE — Patient Instructions (Signed)
BEFORE YOU LEAVE: -follow up: yearly and as needed -labs  -We placed a referral for you as discussed to the breast center. It usually takes about 1-2 weeks to process and schedule this referral. If you have not heard from us regarding this appointment in 2 weeks please contact our office.  We have ordered labs or studies at this visit. It can take up to 1-2 weeks for results and processing. IF results require follow up or explanation, we will call you with instructions. Clinically stable results will be [facility]. If you have not heard from us or cannot find your results in [facility] in 2 weeks please contact our office at [phone].  If you are not yet signed [facility] for [facility], please consider signing up.   We recommend the following healthy lifestyle for LIFE: 1) Small portions.   Tip: eat off of a salad plate instead of a dinner plate.  Tip: It is ok to feel hungry after a meal - that likely means you ate an appropriate portion.  Tip: if you need more or a snack choose fruits, veggies and/or a handful of nuts or seeds.  2) Eat a healthy clean diet.  * Tip: Avoid (less then 1 serving per week): processed foods, sweets, sweetened drinks, white starches (rice, flour, bread, potatoes, pasta, etc), red meat, fast foods, butter  *Tip: CHOOSE instead   * 5-9 servings per day of fresh or frozen fruits and vegetables (but not corn, potatoes, bananas, canned or dried fruit)   *nuts and seeds, beans   *olives and olive oil   *small portions of lean meats such as fish and white chicken    *small portions of whole grains  3)Get at least 150 minutes of sweaty aerobic exercise per week.  4)Reduce stress - consider counseling, meditation and relaxation to balance other aspects of your life.

## 2016-04-07 ENCOUNTER — Other Ambulatory Visit: Payer: Self-pay | Admitting: *Deleted

## 2016-04-07 DIAGNOSIS — N63 Unspecified lump in unspecified breast: Secondary | ICD-10-CM

## 2016-04-07 NOTE — Progress Notes (Signed)
Orders re-entered

## 2016-04-22 ENCOUNTER — Ambulatory Visit (INDEPENDENT_AMBULATORY_CARE_PROVIDER_SITE_OTHER): Payer: 59 | Admitting: Family Medicine

## 2016-04-22 ENCOUNTER — Encounter: Payer: Self-pay | Admitting: Family Medicine

## 2016-04-22 VITALS — BP 108/70 | HR 105 | Temp 98.1°F | Ht 66.5 in | Wt 112.0 lb

## 2016-04-22 DIAGNOSIS — M545 Low back pain, unspecified: Secondary | ICD-10-CM

## 2016-04-22 NOTE — Progress Notes (Signed)
HPI:  Acute visit for:  Back pain and abd lump: -reports for about a week she has had some dull, very mild achy pain in the R, lower back that she feels is related to her chair at work -she then noticed a rib on her RUQ and then started palpating her abd and thought there was a lump in her R mid abdomen -denies: fevers, malaise, nausea, vomiting, unexplained wt loss, change in bowels, trauma, injury, dysuria -regular periods on birth control  ROS: See pertinent positives and negatives per HPI.  Past Medical History:  Diagnosis Date  . Chicken pox   . Frequent headaches     Past Surgical History:  Procedure Laterality Date  . None      Family History  Problem Relation Age of Onset  . Hypertension Mother   . Diabetes Mother   . Breast cancer Mother   . Hypothyroidism Mother   . Healthy Father   . Healthy Sister   . Healthy Daughter   . Healthy Son   . Brain cancer Maternal Grandmother   . Dementia Paternal Grandfather     Social History   Social History  . Marital status: Married    Spouse name: N/A  . Number of children: N/A  . Years of education: N/A   Social History Main Topics  . Smoking status: Former Smoker    Packs/day: 0.25    Years: 4.00    Types: Cigarettes    Quit date: 06/20/2000  . Smokeless tobacco: Never Used  . Alcohol use No  . Drug use: No  . Sexual activity: Yes   Other Topics Concern  . 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      Beliefs: Christian      Lifestyle: no regular exercise; diet is healthy - feels like has been underweight her whole life - eat well      Education: some college.              Current Outpatient Prescriptions:  .  S[place] 28 0.25-35 MG-MCG tablet, Take 1 tablet by mouth daily., Disp: , Rfl: 10  EXAM:  Vitals:   04/22/16 1505  BP: 108/70  Pulse: (!) 105  Temp: 98.1  F (36.7 C)    Body mass index is 17.81 kg/m.  GENERAL: vitals reviewed and listed above, alert, oriented, appears well hydrated and in no acute distress  HEENT: atraumatic, conjunttiva clear, no obvious abnormalities on inspection of external nose and ears  NECK: no obvious masses on inspection  LUNGS: clear to auscultation bilaterally, no wheezes, rales or rhonchi, good air movement  CV: HRRR, no peripheral edema  ABD: BS+, soft, NTTP no appreciable abnormal masses, rebound or guarding - she does have a floating rib bilat that is not abnormal  MS: moves all extremities without noticeable abnormality, TTP in lat dors muscle that reproduces pain  PSYCH: pleasant and cooperative, no obvious depression or anxiety  ASSESSMENT AND PLAN:  Discussed the following assessment and plan:  Acute right-sided low back pain without sciatica  -we discussed possible serious and likely etiologies, workup and treatment, treatment risks and return precautions - she seems to have a muscle strain and I can not appreciate an abd mass but advise could do imaging for her concerns, particularly if they persist and discussed various options  and risks -after this discussion, Catherine Hall opted for heat, stretching for back pain, follow up by phone call in 1 week and consideration of imaging if symptoms persist -advised follow up here to re-examine even if symptoms resolve, but she prefers to call -of course, we advised Catherine Hall  to return or notify a doctor immediately if symptoms worsen or persist or new concerns arise.  Declined avs.  There are no Patient Instructions on file for this visit.  Kriste BasqueKIM, HANNAH R., DO

## 2016-04-22 NOTE — Progress Notes (Signed)
Pre visit review using our clinic review tool, if applicable. No additional management support is needed unless otherwise documented below in the visit note. 

## 2016-05-20 ENCOUNTER — Encounter: Payer: Self-pay | Admitting: Family Medicine

## 2016-05-23 ENCOUNTER — Other Ambulatory Visit: Payer: Self-pay | Admitting: *Deleted

## 2016-05-23 ENCOUNTER — Telehealth: Payer: Self-pay | Admitting: Family Medicine

## 2016-05-23 DIAGNOSIS — R1903 Right lower quadrant abdominal swelling, mass and lump: Secondary | ICD-10-CM

## 2016-05-23 NOTE — Telephone Encounter (Signed)
See My chart message

## 2016-05-23 NOTE — Telephone Encounter (Signed)
° ° ° °  Pt said she is still having issues with the abd pain and still is feeling a mass she said. She said she was told to call back if she not better and a ultra sound would be ordered. Pt is requesting that US  Be ordered.

## 2016-05-23 NOTE — Telephone Encounter (Signed)
I left a message for the pt to return my call. 

## 2016-05-25 ENCOUNTER — Other Ambulatory Visit (INDEPENDENT_AMBULATORY_CARE_PROVIDER_SITE_OTHER): Payer: 59

## 2016-05-25 DIAGNOSIS — R1903 Right lower quadrant abdominal swelling, mass and lump: Secondary | ICD-10-CM | POA: Diagnosis not present

## 2016-05-26 LAB — BASIC METABOLIC PANEL
BUN: 8 mg/dL (ref 6–23)
CALCIUM: 9.4 mg/dL (ref 8.4–10.5)
CO2: 29 meq/L (ref 19–32)
Chloride: 104 mEq/L (ref 96–112)
Creatinine, Ser: 0.88 mg/dL (ref 0.40–1.20)
GFR: 77.43 mL/min (ref >60.00)
Glucose, Bld: 91 mg/dL (ref 70–99)
Potassium: 4.3 mEq/L (ref 3.5–5.1)
SODIUM: 140 meq/L (ref 135–145)

## 2016-06-03 NOTE — Telephone Encounter (Signed)
Pt would like for you to please give her a call at her work #[phone].

## 2016-06-06 ENCOUNTER — Other Ambulatory Visit: Payer: Self-pay | Admitting: *Deleted

## 2016-06-06 DIAGNOSIS — R1900 Intra-abdominal and pelvic swelling, mass and lump, unspecified site: Secondary | ICD-10-CM

## 2016-06-06 NOTE — Progress Notes (Signed)
Order entered

## 2016-06-14 ENCOUNTER — Ambulatory Visit
Admission: RE | Admit: 2016-06-14 | Discharge: 2016-06-14 | Disposition: A | Discharge status: Home / Self Care | Payer: 59 | Source: Ambulatory Visit | Attending: Family Medicine | Admitting: Family Medicine

## 2016-06-14 DIAGNOSIS — R1900 Intra-abdominal and pelvic swelling, mass and lump, unspecified site: Secondary | ICD-10-CM

## 2016-08-30 ENCOUNTER — Other Ambulatory Visit: Payer: Self-pay | Admitting: Obstetrics and Gynecology

## 2016-08-30 DIAGNOSIS — N879 Dysplasia of cervix uteri, unspecified: Secondary | ICD-10-CM | POA: Diagnosis not present

## 2016-08-30 DIAGNOSIS — R1901 Right upper quadrant abdominal swelling, mass and lump: Secondary | ICD-10-CM

## 2016-08-30 DIAGNOSIS — Z01419 Encounter for gynecological examination (general) (routine) without abnormal findings: Secondary | ICD-10-CM | POA: Diagnosis not present

## 2016-09-02 ENCOUNTER — Ambulatory Visit
Admission: RE | Admit: 2016-09-02 | Discharge: 2016-09-02 | Disposition: A | Discharge status: Home / Self Care | Payer: 59 | Source: Ambulatory Visit | Attending: Obstetrics and Gynecology | Admitting: Obstetrics and Gynecology

## 2016-09-02 DIAGNOSIS — R1011 Right upper quadrant pain: Secondary | ICD-10-CM | POA: Diagnosis not present

## 2016-09-02 DIAGNOSIS — R1901 Right upper quadrant abdominal swelling, mass and lump: Secondary | ICD-10-CM

## 2016-09-02 MED ORDER — IOPAMIDOL (ISOVUE-300) INJECTION 61%
100.0000 mL | Freq: Once | INTRAVENOUS | Status: AC | PRN
  Administered 2016-09-02: 100 mL via INTRAVENOUS

## 2017-01-06 DIAGNOSIS — R1901 Right upper quadrant abdominal swelling, mass and lump: Secondary | ICD-10-CM | POA: Diagnosis not present

## 2017-03-09 ENCOUNTER — Encounter: Payer: Self-pay | Admitting: Family Medicine

## 2017-03-31 DIAGNOSIS — M546 Pain in thoracic spine: Secondary | ICD-10-CM | POA: Diagnosis not present

## 2017-03-31 DIAGNOSIS — R1084 Generalized abdominal pain: Secondary | ICD-10-CM | POA: Diagnosis not present

## 2017-05-15 DIAGNOSIS — R1084 Generalized abdominal pain: Secondary | ICD-10-CM | POA: Diagnosis not present

## 2017-05-15 DIAGNOSIS — N2883 Nephroptosis: Secondary | ICD-10-CM | POA: Diagnosis not present

## 2017-05-15 DIAGNOSIS — M546 Pain in thoracic spine: Secondary | ICD-10-CM | POA: Diagnosis not present

## 2017-05-23 DIAGNOSIS — N879 Dysplasia of cervix uteri, unspecified: Secondary | ICD-10-CM | POA: Diagnosis not present

## 2017-05-23 DIAGNOSIS — Z0142 Encounter for cervical smear to confirm findings of recent normal smear following initial abnormal smear: Secondary | ICD-10-CM | POA: Diagnosis not present

## 2017-06-23 DIAGNOSIS — N2883 Nephroptosis: Secondary | ICD-10-CM | POA: Diagnosis not present

## 2017-06-29 ENCOUNTER — Encounter: Payer: Self-pay | Admitting: Family Medicine

## 2017-08-01 DIAGNOSIS — M9903 Segmental and somatic dysfunction of lumbar region: Secondary | ICD-10-CM | POA: Diagnosis not present

## 2017-08-01 DIAGNOSIS — M9901 Segmental and somatic dysfunction of cervical region: Secondary | ICD-10-CM | POA: Diagnosis not present

## 2017-08-01 DIAGNOSIS — M9902 Segmental and somatic dysfunction of thoracic region: Secondary | ICD-10-CM | POA: Diagnosis not present

## 2017-08-02 DIAGNOSIS — M9901 Segmental and somatic dysfunction of cervical region: Secondary | ICD-10-CM | POA: Diagnosis not present

## 2017-08-02 DIAGNOSIS — M9903 Segmental and somatic dysfunction of lumbar region: Secondary | ICD-10-CM | POA: Diagnosis not present

## 2017-08-02 DIAGNOSIS — M9902 Segmental and somatic dysfunction of thoracic region: Secondary | ICD-10-CM | POA: Diagnosis not present

## 2017-08-04 DIAGNOSIS — M9901 Segmental and somatic dysfunction of cervical region: Secondary | ICD-10-CM | POA: Diagnosis not present

## 2017-08-04 DIAGNOSIS — M9902 Segmental and somatic dysfunction of thoracic region: Secondary | ICD-10-CM | POA: Diagnosis not present

## 2017-08-04 DIAGNOSIS — M9903 Segmental and somatic dysfunction of lumbar region: Secondary | ICD-10-CM | POA: Diagnosis not present

## 2017-08-09 DIAGNOSIS — M9902 Segmental and somatic dysfunction of thoracic region: Secondary | ICD-10-CM | POA: Diagnosis not present

## 2017-08-09 DIAGNOSIS — M9903 Segmental and somatic dysfunction of lumbar region: Secondary | ICD-10-CM | POA: Diagnosis not present

## 2017-08-09 DIAGNOSIS — M9901 Segmental and somatic dysfunction of cervical region: Secondary | ICD-10-CM | POA: Diagnosis not present

## 2017-08-14 DIAGNOSIS — M9902 Segmental and somatic dysfunction of thoracic region: Secondary | ICD-10-CM | POA: Diagnosis not present

## 2017-08-14 DIAGNOSIS — M9901 Segmental and somatic dysfunction of cervical region: Secondary | ICD-10-CM | POA: Diagnosis not present

## 2017-08-14 DIAGNOSIS — M9903 Segmental and somatic dysfunction of lumbar region: Secondary | ICD-10-CM | POA: Diagnosis not present

## 2017-08-18 DIAGNOSIS — M9901 Segmental and somatic dysfunction of cervical region: Secondary | ICD-10-CM | POA: Diagnosis not present

## 2017-08-18 DIAGNOSIS — M9903 Segmental and somatic dysfunction of lumbar region: Secondary | ICD-10-CM | POA: Diagnosis not present

## 2017-08-18 DIAGNOSIS — M9902 Segmental and somatic dysfunction of thoracic region: Secondary | ICD-10-CM | POA: Diagnosis not present

## 2017-08-23 DIAGNOSIS — M9901 Segmental and somatic dysfunction of cervical region: Secondary | ICD-10-CM | POA: Diagnosis not present

## 2017-08-23 DIAGNOSIS — M9903 Segmental and somatic dysfunction of lumbar region: Secondary | ICD-10-CM | POA: Diagnosis not present

## 2017-08-23 DIAGNOSIS — M9902 Segmental and somatic dysfunction of thoracic region: Secondary | ICD-10-CM | POA: Diagnosis not present

## 2017-08-29 ENCOUNTER — Telehealth: Payer: Self-pay | Admitting: *Deleted

## 2017-08-29 NOTE — Telephone Encounter (Signed)
-----   Message from Terressa KoyanagiHannah R Kim, DO sent at 06/15/2017  9:24 PM EST ----- Please check on the status of these overdue results. Schedule if needed.Thanks.   ----- Message ----- From: SYSTEM Sent: 06/12/2017  12:06 AM To: Terressa KoyanagiHannah R Kim, DO

## 2017-08-29 NOTE — Telephone Encounter (Signed)
I called the pt and she stated she has had other things going on with her kidney and declines the testing at this time.

## 2018-06-11 DIAGNOSIS — Z01419 Encounter for gynecological examination (general) (routine) without abnormal findings: Secondary | ICD-10-CM | POA: Diagnosis not present

## 2018-06-11 DIAGNOSIS — Z681 Body mass index (BMI) 19 or less, adult: Secondary | ICD-10-CM | POA: Diagnosis not present

## 2018-07-02 ENCOUNTER — Other Ambulatory Visit: Payer: Self-pay | Admitting: Urology

## 2018-07-02 ENCOUNTER — Other Ambulatory Visit (HOSPITAL_COMMUNITY): Payer: Self-pay | Admitting: Urology

## 2018-07-02 DIAGNOSIS — N2883 Nephroptosis: Secondary | ICD-10-CM

## 2018-07-16 ENCOUNTER — Ambulatory Visit (HOSPITAL_COMMUNITY)
Admission: RE | Admit: 2018-07-16 | Discharge: 2018-07-16 | Disposition: A | Discharge status: Home / Self Care | Payer: 59 | Source: Ambulatory Visit | Attending: Urology | Admitting: Urology

## 2018-07-16 DIAGNOSIS — N2883 Nephroptosis: Secondary | ICD-10-CM

## 2018-07-16 MED ORDER — FUROSEMIDE 10 MG/ML IJ SOLN
INTRAMUSCULAR | Status: AC
  Filled 2018-07-16: qty 4

## 2018-07-16 MED ORDER — FUROSEMIDE 10 MG/ML IJ SOLN
25.4000 mg | Freq: Once | INTRAMUSCULAR | Status: AC
Start: 2018-07-16 — End: 2018-07-16
  Administered 2018-07-16: 25 mg via INTRAVENOUS

## 2020-12-16 IMAGING — NM NM RENAL IMAGING FLOW W/ PHARM
2 series · 12 of 12 positions shown · non-contrast
Comparison: CT abdomen and pelvis 09/02/2016

CLINICAL DATA: Nephroptosis

EXAM:
NUCLEAR MEDICINE RENAL SCAN WITH DIURETIC ADMINISTRATION
TECHNIQUE: Radionuclide angiographic and sequential renal images were obtained
after intravenous injection of radiopharmaceutical. Imaging was
continued during slow intravenous injection of Lasix approximately
15 minutes after the start of the examination.
RADIOPHARMACEUTICALS:  5.4 mCi Zechnetium-UUm MAG3 IV
Pharmaceutical: Lasix 26 mg IV

[Series 2: renal scan · 4.14mm/px · 6 of 71 frames shown (1 of 2)]
[frame 6/71]
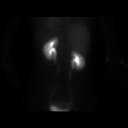
[frame 18/71]
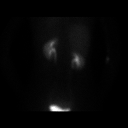
[frame 30/71]
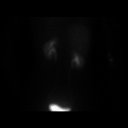
[frame 42/71]
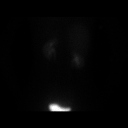
[frame 54/71]
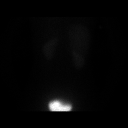
[frame 66/71]
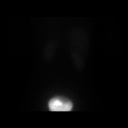

[Series 2: renal scan · 4.14mm/px · 6 of 40 frames shown (2 of 2)]
[frame 4/40]
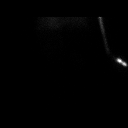
[frame 10/40]
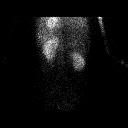
[frame 17/40]
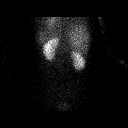
[frame 24/40]
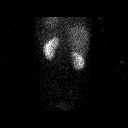
[frame 30/40]
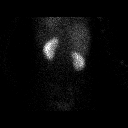
[frame 37/40]
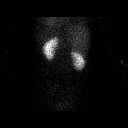

[12 of 12 positions shown; findings below may reference images not displayed]

FINDINGS: Flow:  Prompt symmetric arterial flow to the kidneys.

Left renogram: Normal uptake, concentration and excretion of tracer
by LEFT kidney. Analysis of the renogram curve demonstrates a normal
time to peak activity of 3.2 minutes with fall to half maximum
activity 4.6 minutes later. No significant residual tracer within
the LEFT kidney at the conclusion of the exam.

Right renogram: Normal uptake, concentration, and excretion of
tracer by the RIGHT kidney. RIGHT kidney is minimally ptotic.
Analysis of the renogram curve demonstrates a normal time to peak
activity of 3.7 minutes with fall to half maximum activity
minutes later. No significant residual tracer within the RIGHT
kidney at the conclusion of the exam.

Differential:

Left kidney = 52 %

Right kidney = 48 %

T1/2 post Lasix :

Left kidney = 3.8 min

Right kidney = 4.0 min
IMPRESSION: Minimally ptotic RIGHT kidney.

Otherwise normal exam.

## 2021-07-07 ENCOUNTER — Other Ambulatory Visit: Payer: Self-pay | Admitting: Radiology

## 2021-08-08 ENCOUNTER — Other Ambulatory Visit: Payer: Self-pay

## 2021-08-08 ENCOUNTER — Emergency Department (HOSPITAL_COMMUNITY)
Admission: EM | Admit: 2021-08-08 | Discharge: 2021-08-10 | Disposition: A | Discharge status: Other Acute Inpatient Hospital | Payer: 59 | Source: Home / Self Care | Attending: Emergency Medicine | Admitting: Emergency Medicine

## 2021-08-08 DIAGNOSIS — R4689 Other symptoms and signs involving appearance and behavior: Secondary | ICD-10-CM

## 2021-08-08 DIAGNOSIS — N9489 Other specified conditions associated with female genital organs and menstrual cycle: Secondary | ICD-10-CM | POA: Insufficient documentation

## 2021-08-08 DIAGNOSIS — Z20822 Contact with and (suspected) exposure to covid-19: Secondary | ICD-10-CM | POA: Insufficient documentation

## 2021-08-08 DIAGNOSIS — F3110 Bipolar disorder, current episode manic without psychotic features, unspecified: Secondary | ICD-10-CM | POA: Diagnosis not present

## 2021-08-08 DIAGNOSIS — Z79899 Other long term (current) drug therapy: Secondary | ICD-10-CM | POA: Insufficient documentation

## 2021-08-08 DIAGNOSIS — F29 Unspecified psychosis not due to a substance or known physiological condition: Secondary | ICD-10-CM | POA: Insufficient documentation

## 2021-08-08 DIAGNOSIS — F3173 Bipolar disorder, in partial remission, most recent episode manic: Secondary | ICD-10-CM | POA: Diagnosis not present

## 2021-08-08 DIAGNOSIS — F488 Other specified nonpsychotic mental disorders: Secondary | ICD-10-CM

## 2021-08-08 LAB — CBC WITH DIFFERENTIAL/PLATELET
Abs Immature Granulocytes: 0.02 10*3/uL (ref 0.00–0.07)
Basophils Absolute: 0 10*3/uL (ref 0.0–0.1)
Basophils Relative: 0 %
Eosinophils Absolute: 0 10*3/uL (ref 0.0–0.5)
Eosinophils Relative: 0 %
HCT: 42.2 % (ref 36.0–46.0)
Hemoglobin: 14 g/dL (ref 12.0–15.0)
Immature Granulocytes: 0 %
Lymphocytes Relative: 18 %
Lymphs Abs: 1.6 10*3/uL (ref 0.7–4.0)
MCH: 30.6 pg (ref 26.0–34.0)
MCHC: 33.2 g/dL (ref 30.0–36.0)
MCV: 92.3 fL (ref 80.0–100.0)
Monocytes Absolute: 0.8 10*3/uL (ref 0.1–1.0)
Monocytes Relative: 9 %
Neutro Abs: 6.5 10*3/uL (ref 1.7–7.7)
Neutrophils Relative %: 73 %
Platelets: 110 10*3/uL — ABNORMAL LOW (ref 150–400)
RBC: 4.57 MIL/uL (ref 3.87–5.11)
RDW: 12.7 % (ref 11.5–15.5)
WBC: 9 10*3/uL (ref 4.0–10.5)
nRBC: 0 % (ref 0.0–0.2)

## 2021-08-08 LAB — RAPID URINE DRUG SCREEN, HOSP PERFORMED
Amphetamines: NOT DETECTED
Barbiturates: NOT DETECTED
Benzodiazepines: POSITIVE — AB
Cocaine: NOT DETECTED
Opiates: NOT DETECTED
Tetrahydrocannabinol: POSITIVE — AB

## 2021-08-08 LAB — COMPREHENSIVE METABOLIC PANEL
ALT: 17 U/L (ref 0–44)
AST: 26 U/L (ref 15–41)
Albumin: 4.9 g/dL (ref 3.5–5.0)
Alkaline Phosphatase: 60 U/L (ref 38–126)
Anion gap: 8 (ref 5–15)
BUN: 7 mg/dL (ref 6–20)
CO2: 26 mmol/L (ref 22–32)
Calcium: 9.8 mg/dL (ref 8.9–10.3)
Chloride: 104 mmol/L (ref 98–111)
Creatinine, Ser: 0.69 mg/dL (ref 0.44–1.00)
GFR, Estimated: >60 mL/min (ref >60)
Glucose, Bld: 104 mg/dL — ABNORMAL HIGH (ref 70–99)
Potassium: 3.5 mmol/L (ref 3.5–5.1)
Sodium: 138 mmol/L (ref 135–145)
Total Bilirubin: 0.7 mg/dL (ref 0.3–1.2)
Total Protein: 8.3 g/dL — ABNORMAL HIGH (ref 6.5–8.1)

## 2021-08-08 LAB — I-STAT BETA HCG BLOOD, ED (MC, WL, AP ONLY): I-stat hCG, quantitative: <5 m[IU]/mL (ref <5)

## 2021-08-08 LAB — ETHANOL: Alcohol, Ethyl (B): <10 mg/dL (ref <10)

## 2021-08-08 MED ORDER — LORAZEPAM 1 MG PO TABS
1.0000 mg | ORAL_TABLET | ORAL | Status: DC | PRN
  Filled 2021-08-08: qty 1

## 2021-08-08 MED ORDER — ZOLPIDEM TARTRATE 5 MG PO TABS
5.0000 mg | ORAL_TABLET | Freq: Every evening | ORAL | Status: DC | PRN
  Administered 2021-08-09: 5 mg via ORAL
  Filled 2021-08-08: qty 1

## 2021-08-08 MED ORDER — RISPERIDONE 0.5 MG PO TBDP
2.0000 mg | ORAL_TABLET | Freq: Three times a day (TID) | ORAL | Status: DC | PRN
  Filled 2021-08-08: qty 4

## 2021-08-08 MED ORDER — STERILE WATER FOR INJECTION IJ SOLN
INTRAMUSCULAR | Status: AC
  Filled 2021-08-08: qty 10

## 2021-08-08 MED ORDER — ZIPRASIDONE MESYLATE 20 MG IM SOLR
20.0000 mg | INTRAMUSCULAR | Status: AC | PRN
  Administered 2021-08-08: 20 mg via INTRAMUSCULAR
  Filled 2021-08-08: qty 20

## 2021-08-08 MED ORDER — LIP MEDEX EX OINT
TOPICAL_OINTMENT | Freq: Once | CUTANEOUS | Status: AC
  Filled 2021-08-08: qty 7

## 2021-08-08 NOTE — ED Triage Notes (Signed)
Ems brings pt in from home for manic behavior. Family reports pt has started talking to people that are not in the room, religious chants, speaking in tongues. Denies BH history.

## 2021-08-08 NOTE — ED Notes (Signed)
Pt is sleeping so her tray is outside her room if she wakes up and is hungry

## 2021-08-08 NOTE — ED Notes (Signed)
Pt's sister at desk stating pt is becoming more awake and not wanting to cooperate; pill refusing to swallow pills; Geodon IM given; pt's sister and dad at bedside at this time

## 2021-08-08 NOTE — ED Provider Notes (Addendum)
[facility] DEPT Provider Note   CSN: [id number] Arrival date & time: 08/08/21  1613     History  Chief Complaint  Patient presents with   Manic Behavior    Catherine Hall is a 41 y.o. female.  HPI     41 year old female brought into the emergency room by EMS with chief complaint of manic behavior.  History provided by EMS, patient's husband and also her sister.  According to EMS, PD were called to the site by patient's friend by the name of lately.  When PD arrived, patient was hostile towards them.  Underlying cause was mental illness, therefore PD called EMS.  When EMS arrived, patient was cooperative at times and other times hostile and aggressive.  She required 5 mg of Versed and 5 mg of Haldol prior to ED arrival.  According to patient's husband, patient has not slept in the last week.  She at most is getting an hour or 2 of sleep.  She has not been sleeping he thinks because of stress at work.  Last night patient started getting more agitated, and today she was screaming.  He did not call police.  He felt that if she got rest, she would get better.  Patient's sister indicates that patient has been under a lot of stress because she cannot say no.  She has had stress at work, stress at home as she is homeschooling and she is mending her relationship with her husband and also her 74 year old child.   No previous events like this.  EMS indicates that patient was talking in tongues and Latin.  Patient is not providing meaningful history.  She has made some nonsensical remarks to specific question about her identity order her husband's identity.  Home Medications Prior to Admission medications   Medication Sig Start Date End Date Taking? Authorizing Provider  [place] 28 0.25-35 MG-MCG tablet Take 1 tablet by mouth daily. 10/20/14   [provider]      Allergies    Patient has no known allergies.    Review of Systems   Review of  Systems  Unable to perform ROS: Mental status change   Physical Exam Updated Vital Signs BP (!) 105/39    Pulse 77    Resp 18    SpO2 100%  Physical Exam Vitals and nursing note reviewed.  Constitutional:      Appearance: She is well-developed.     Comments: Somnolent  HENT:     Head: Atraumatic.  Cardiovascular:     Rate and Rhythm: Normal rate.  Pulmonary:     Effort: Pulmonary effort is normal.  Musculoskeletal:     Cervical back: Normal range of motion and neck supple.  Skin:    General: Skin is warm and dry.  Psychiatric:     Comments: Abnormal thought content, abnormal judgment    ED Results / Procedures / Treatments   Labs (all labs ordered are listed, but only abnormal results are displayed) Labs Reviewed  CBC WITH DIFFERENTIAL/PLATELET - Abnormal; Notable for the following components:      Result Value   Platelets 110 (*)    All other components within normal limits  RESP PANEL BY RT-PCR (FLU A&B, COVID) ARPGX2  RAPID URINE DRUG SCREEN, HOSP PERFORMED  ETHANOL  COMPREHENSIVE METABOLIC PANEL  I-STAT BETA HCG BLOOD, ED ([facility], [facility], [facility] ONLY)    EKG EKG Interpretation  Date/Time:  Sunday August 08 2021 17:34:56 EST Ventricular Rate:  88 PR Interval:  125 QRS Duration: 86 QT Interval:  381 QTC Calculation: 461 R Axis:   -52 Text Interpretation: Sinus rhythm Left anterior fascicular block Low voltage, extremity leads Abnormal R-wave progression, early transition No acute changes Confirmed by Varney Biles [phone]) on 08/08/2021 6:03:53 PM  Radiology No results found.  Procedures .Critical Care Performed by: Varney Biles, MD Authorized by: Varney Biles, MD   Critical care provider statement:    Critical care time (minutes):  58   Critical care was necessary to treat or prevent imminent or life-threatening deterioration of the following conditions: Psychiatric breakdown.   Critical care was time spent personally by me on the following activities:   Development of treatment plan with patient or surrogate, discussions with consultants, evaluation of patient's response to treatment, examination of patient, ordering and review of laboratory studies, ordering and review of radiographic studies, ordering and performing treatments and interventions, pulse oximetry, re-evaluation of patient's condition and review of old charts    Medications Ordered in ED Medications  risperiDONE (RISPERDAL M-TABS) disintegrating tablet 2 mg (has no administration in time range)    And  LORazepam (ATIVAN) tablet 1 mg (has no administration in time range)    And  ziprasidone (GEODON) injection 20 mg (has no administration in time range)  zolpidem (AMBIEN) tablet 5 mg (has no administration in time range)    ED Course/ Medical Decision Making/ A&P Clinical Course as of 08/08/21 1806  Sun Aug 08, 2021  1806 Patient reassessed on 2 separate occasions.  She continues to be directable.  As needed agitation medications ordered. [AN]    Clinical Course User Index [AN] Varney Biles, MD                           Medical Decision Making Amount and/or Complexity of Data Reviewed Labs: ordered.  Risk Prescription drug management.   Pt comes in with cc of behavioral change.  Differential diagnosis includes bipolar disorder with mania, mental breakdown due to exhaustion, psychosis.  Clinical suspicion for medical etiology less such as stroke or brain bleed is low.  There is no focal neurodeficits  Patient will require involuntary commitment.  She has already attempted to leave the ED on 2 separate occasions.  Fortunately, she has been directable.  Extensive history obtained from patient's husband and sister.  EMS also provided important collateral history.    Final Clinical Impression(s) / ED Diagnoses Final diagnoses:  Mental exhaustion  Behavioral change    Rx / DC Orders ED Discharge Orders     None         Varney Biles, MD 08/08/21  1806    Varney Biles, MD 08/08/21 1806

## 2021-08-08 NOTE — ED Notes (Signed)
Pt is dressed out and belongings are in 1 bag consisting of: blue jeans, Mar Zettler hooded top, gray sports bra, and Kayelynn Abdou sneakers.

## 2021-08-08 NOTE — ED Notes (Signed)
Patient discovered by this nurse attempting to leave ED. Patient repeatedly saying "victor if you can see me don't leave" . Patient redirected to her room by another staff member. Primary nurse contacted provider regarding incident.

## 2021-08-08 NOTE — BH Assessment (Signed)
TTS clinician attempted to complete assessment. Per Nash Dimmer, RN, patient is asleep and unable to arouse. TTS will attempt at a later time.

## 2021-08-09 NOTE — ED Notes (Signed)
Attempted to do a Covid swab on pt..the patient wouldn't let me do it stated that I need to read the bible verse that states she is god and she doesn't have to and she will begin to scream and yell and run away this tech informed nurse

## 2021-08-09 NOTE — ED Notes (Signed)
Patient belonging bag transferred back to TCU.

## 2021-08-09 NOTE — ED Notes (Signed)
Patient refused Covid test.  

## 2021-08-09 NOTE — BH Assessment (Signed)
Clinician made contact with pt's team at [phone] to determine if pt is awake and able to participate in her MH Assessment at this time. Pt's nurse, Inetta Fermo RN, returned a message at [phone] stating pt is asleep at this time from the effects of Geodon IM given. Clinician requested contact if pt wakes up and is able to participate in assessment.

## 2021-08-09 NOTE — ED Notes (Signed)
Patient has been cooperative.  Disorganized thoughts. Slight Paranoia noted.  Slightly hyper verbal.

## 2021-08-09 NOTE — ED Notes (Signed)
Patient to room 28. Patient alert , cooperative, no s/s of distress. Patient oriented to unit and room.

## 2021-08-09 NOTE — ED Notes (Signed)
Nurse attempted to obtain COVID respiratory panel nasal swab on patient and patient let nurse know that she does not want a COVID swab done at all while she is here.
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
## 2021-08-10 ENCOUNTER — Encounter (HOSPITAL_COMMUNITY): Payer: Self-pay | Admitting: Family

## 2021-08-10 ENCOUNTER — Inpatient Hospital Stay (HOSPITAL_COMMUNITY)
Admission: AD | Admit: 2021-08-10 | Discharge: 2021-08-13 | DRG: 885 | Disposition: A | Discharge status: Home / Self Care | Payer: 59 | Source: Intra-hospital | Attending: Psychiatry | Admitting: Psychiatry

## 2021-08-10 ENCOUNTER — Other Ambulatory Visit: Payer: Self-pay

## 2021-08-10 DIAGNOSIS — F3110 Bipolar disorder, current episode manic without psychotic features, unspecified: Secondary | ICD-10-CM | POA: Diagnosis present

## 2021-08-10 DIAGNOSIS — Z79899 Other long term (current) drug therapy: Secondary | ICD-10-CM

## 2021-08-10 DIAGNOSIS — R519 Headache, unspecified: Secondary | ICD-10-CM | POA: Diagnosis present

## 2021-08-10 DIAGNOSIS — F3173 Bipolar disorder, in partial remission, most recent episode manic: Principal | ICD-10-CM | POA: Diagnosis present

## 2021-08-10 DIAGNOSIS — Z20822 Contact with and (suspected) exposure to covid-19: Secondary | ICD-10-CM | POA: Diagnosis present

## 2021-08-10 DIAGNOSIS — F419 Anxiety disorder, unspecified: Secondary | ICD-10-CM | POA: Diagnosis present

## 2021-08-10 DIAGNOSIS — Z87891 Personal history of nicotine dependence: Secondary | ICD-10-CM

## 2021-08-10 DIAGNOSIS — F23 Brief psychotic disorder: Secondary | ICD-10-CM | POA: Diagnosis not present

## 2021-08-10 DIAGNOSIS — Z8619 Personal history of other infectious and parasitic diseases: Secondary | ICD-10-CM | POA: Diagnosis not present

## 2021-08-10 DIAGNOSIS — F303 Manic episode in partial remission: Secondary | ICD-10-CM

## 2021-08-10 LAB — RESP PANEL BY RT-PCR (FLU A&B, COVID) ARPGX2
Influenza A by PCR: NEGATIVE
Influenza B by PCR: NEGATIVE
SARS Coronavirus 2 by RT PCR: NEGATIVE

## 2021-08-10 MED ORDER — MAGNESIUM HYDROXIDE 400 MG/5ML PO SUSP: 30.0000 mL | Freq: Every day | ORAL | Status: DC | PRN

## 2021-08-10 MED ORDER — ZOLPIDEM TARTRATE 5 MG PO TABS: 5.0000 mg | ORAL_TABLET | Freq: Every evening | ORAL | Status: DC | PRN

## 2021-08-10 MED ORDER — HALOPERIDOL 2 MG PO TABS
2.0000 mg | ORAL_TABLET | Freq: Four times a day (QID) | ORAL | Status: DC | PRN
  Administered 2021-08-10 – 2021-08-12 (×2): 2 mg via ORAL
  Filled 2021-08-10 (×2): qty 1

## 2021-08-10 MED ORDER — ALUM & MAG HYDROXIDE-SIMETH 200-200-20 MG/5ML PO SUSP: 30.0000 mL | ORAL | Status: DC | PRN

## 2021-08-10 MED ORDER — BENZTROPINE MESYLATE 1 MG PO TABS
1.0000 mg | ORAL_TABLET | Freq: Four times a day (QID) | ORAL | Status: DC | PRN
Start: 2021-08-10 — End: 2021-08-13
  Administered 2021-08-10 – 2021-08-12 (×2): 1 mg via ORAL
  Filled 2021-08-10 (×2): qty 1

## 2021-08-10 MED ORDER — IBUPROFEN 400 MG PO TABS
400.0000 mg | ORAL_TABLET | Freq: Four times a day (QID) | ORAL | Status: DC | PRN
  Administered 2021-08-11 – 2021-08-13 (×3): 400 mg via ORAL
  Filled 2021-08-10 (×2): qty 1

## 2021-08-10 MED ORDER — HALOPERIDOL LACTATE 5 MG/ML IJ SOLN: 2.0000 mg | Freq: Four times a day (QID) | INTRAMUSCULAR | Status: DC | PRN

## 2021-08-10 MED ORDER — LORAZEPAM 1 MG PO TABS: 1.0000 mg | ORAL_TABLET | ORAL | Status: DC | PRN

## 2021-08-10 MED ORDER — RISPERIDONE 2 MG PO TBDP: 2.0000 mg | ORAL_TABLET | Freq: Three times a day (TID) | ORAL | Status: DC | PRN

## 2021-08-10 MED ORDER — LORAZEPAM 1 MG PO TABS
2.0000 mg | ORAL_TABLET | Freq: Four times a day (QID) | ORAL | Status: DC | PRN
Start: 2021-08-10 — End: 2021-08-13
  Administered 2021-08-10 – 2021-08-12 (×2): 2 mg via ORAL
  Filled 2021-08-10 (×2): qty 2

## 2021-08-10 MED ORDER — LORAZEPAM 2 MG/ML IJ SOLN
2.0000 mg | Freq: Four times a day (QID) | INTRAMUSCULAR | Status: DC | PRN
Start: 2021-08-10 — End: 2021-08-13

## 2021-08-10 MED ORDER — QUETIAPINE FUMARATE 50 MG PO TABS
50.0000 mg | ORAL_TABLET | Freq: Every day | ORAL | Status: DC
  Administered 2021-08-10 – 2021-08-11 (×2): 50 mg via ORAL
  Filled 2021-08-10 (×5): qty 1

## 2021-08-10 MED ORDER — LORAZEPAM 1 MG PO TABS
1.0000 mg | ORAL_TABLET | Freq: Every evening | ORAL | Status: DC | PRN
Start: 2021-08-10 — End: 2021-08-13

## 2021-08-10 MED ORDER — BENZTROPINE MESYLATE 1 MG/ML IJ SOLN: 1.0000 mg | Freq: Four times a day (QID) | INTRAMUSCULAR | Status: DC | PRN

## 2021-08-10 MED ORDER — QUETIAPINE FUMARATE 25 MG PO TABS
25.0000 mg | ORAL_TABLET | Freq: Two times a day (BID) | ORAL | Status: DC
  Filled 2021-08-10: qty 1

## 2021-08-10 MED ORDER — QUETIAPINE FUMARATE 25 MG PO TABS
25.0000 mg | ORAL_TABLET | Freq: Two times a day (BID) | ORAL | Status: DC
  Filled 2021-08-10 (×2): qty 1

## 2021-08-10 MED ORDER — ACETAMINOPHEN 325 MG PO TABS: 650.0000 mg | ORAL_TABLET | Freq: Four times a day (QID) | ORAL | Status: DC | PRN

## 2021-08-10 MED ORDER — ZIPRASIDONE MESYLATE 20 MG IM SOLR: 20.0000 mg | INTRAMUSCULAR | Status: DC | PRN

## 2021-08-10 NOTE — ED Notes (Signed)
Patient husband visiting at this time.
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
## 2021-08-10 NOTE — ED Provider Notes (Signed)
Emergency Medicine Observation Re-evaluation Note  Catherine Hall is a 41 y.o. female, seen on rounds today.  Pt initially presented to the ED for complaints of Manic Behavior Currently, the patient is a 41 year old female who presented to the emergency department with a chief complaint of manic behavior.  It was reported that she had not been sleeping for a week which was thought to be secondary to stress at work.  The patient became more agitated and was screaming at home.  There is no reported prior behavioral health diagnoses.  Physical Exam  BP 102/81 (BP Location: Right Arm)    Pulse (!) 118    Temp (!) 97.5 F (36.4 C) (Oral)    Resp 16    SpO2 98%  Physical Exam General: wdwn female nad Cardiac: rrr Lungs: cta Psych: patient calm and interactive  ED Course / MDM  EKG:EKG Interpretation  Date/Time:  Sunday August 08 2021 17:34:56 EST Ventricular Rate:  88 PR Interval:  125 QRS Duration: 86 QT Interval:  381 QTC Calculation: 461 R Axis:   -52 Text Interpretation: Sinus rhythm Left anterior fascicular block Low voltage, extremity leads Abnormal R-wave progression, early transition No acute changes Confirmed by Varney Biles [id number]) on 08/08/2021 6:03:53 PM  I have reviewed the labs performed to date as well as medications administered while in observation.  Recent changes in the last 24 hours include -patient improved and awake and alert and wishes d/c.  Plan  Current plan is for patient to be reassessed by bh. Catherine Hall is under involuntary commitment.      [name], MD 08/10/21 [phone]

## 2021-08-10 NOTE — ED Notes (Signed)
Report called to [facility] 

## 2021-08-10 NOTE — ED Notes (Signed)
Transport called.

## 2021-08-10 NOTE — ED Notes (Signed)
Patient refusing to take oral Seroquel, stating "nothing is wrong with me".

## 2021-08-10 NOTE — ED Notes (Addendum)
Catherine Hall was approached this writer in an attempt to obtain a COVID swab specimen and was refused. SHe also stated she is expecting to be discharged this morning to home because her little girl is expecting her to be there to do her hair so she is refusing any further care. Will notify all providers of her wishes.

## 2021-08-10 NOTE — Progress Notes (Signed)
Patient IVC'D by police department due to disruptive behavior. She arrived to the unit with police escort at 1530. Patient is alert and oriented X'S 4, vital signs were as follows during admission: 99.0, 128/69, 109, 16, 100%. Patient is religiously preoccupied and paranoid stating "I know I'm not save here, my only savior is Jesus." No contraband found during body checks, skin intact with no track marks noted. Patient denies SI/HI/VH, patient states " I hear Jesus talking to me all the time." staff talked her into taking her scheduled medication, she tolerated medication well with no side effect noted. Patient was appreciative of the plate of salad given to her. Patient is resting in room at this time. Will continue to monitor and treats per providers orders.

## 2021-08-10 NOTE — ED Notes (Signed)
TTS machine in pt room pt ready for evaluations

## 2021-08-10 NOTE — Group Note (Deleted)
Recreation Therapy Group Note   Group Topic:Animal Assisted Therapy   Group Date: 08/10/2021 Start Time: 1420 End Time: 1500 Facilitators: Vikki Ports, NT Location: [address] Day[address]       Affect/Mood: {RT [facility] Affect/Mood:26271}   Participation Level: {RT BHH Participation [employer]   Participation Quality: {RT [facility] Participation Quality:26268}   Behavior: {RT [facility] Group Behavior:26269}   Speech/Thought Process: {RT BHH Speech/Thought:26276}   Insight: {RT BHH Insight:26272}   Judgement: {RT BHH Judgement:26278}   Modes of Intervention: {RT [facility]HH Modes of Intervention:26277}   Patient Response to Interventions:  {RT BHH Patient Response to Intervention:26274}   Education Outcome:  {RT [place] Education Outcome:26279}   Clinical Observations/Individualized Feedback: *** was *** in their participation of session activities and group discussion. Pt identified ***   Plan: {RT [facility] Tx [id number]   Vikki Ports, NT,  08/10/2021 3:50 PM

## 2021-08-10 NOTE — ED Notes (Signed)
Covid swab complete

## 2021-08-10 NOTE — BH Assessment (Signed)
[facility] Assessment Progress Note   Per Margorie John, PA, this pt requires psychiatric hospitalization.  Linsey, RN, [facility] has assigned pt to [facility] Rm 403-1 to the service of Dr Berdine Addison.  Please note that this bed will be available as long as pt's behaviors do not escalate.  If they do, pt would need to go to a more secure unit where beds are not currently available.  Linsey will call when [facility] is ready for transfer.  Pt presents under IVC initiated by EDP Varney Biles, MD and IVC documents have been sent  to [facility].  EDP [name], MD and pt's nurse, Lisette Grinder, have been notified, and [facility]ita agrees to call report to [phone].  Pt is to be transported via Event organiser.   Jalene Mullet, [place] Coordinator [phone]

## 2021-08-10 NOTE — Plan of Care (Signed)
  Problem: Education: Goal: Emotional status will improve Outcome: Progressing   

## 2021-08-10 NOTE — Tx Team (Signed)
Initial Treatment Plan 08/10/2021 6:10 PM Catherine Hall [id number]    PATIENT STRESSORS: Marital or family conflict   Occupational concerns   Substance abuse     PATIENT STRENGTHS: Average or above average intelligence  Religious Affiliation  Supportive family/friends    PATIENT IDENTIFIED PROBLEMS: "There nothing wrong with me, I don;t need to be here."  Psychosis  Delusion  Anxiety               DISCHARGE CRITERIA:  Adequate post-discharge living arrangements Improved stabilization in mood, thinking, and/or behavior Safe-care adequate arrangements made  PRELIMINARY DISCHARGE PLAN: Outpatient therapy  PATIENT/FAMILY INVOLVEMENT: This treatment plan has been presented to and reviewed with the patient, Catherine Hall,  The patient has been given the opportunity to ask questions and make suggestions.  Dorris Carnes, RN 08/10/2021, 6:10 PM

## 2021-08-10 NOTE — Progress Notes (Signed)
Melbourne Abts, PA-C recommends inpatient psychiatric treatment. CSW requested [facility] AC to review.   Maryjean Ka, MSW, LCSWA 08/10/2021 1:06 AM

## 2021-08-10 NOTE — ED Notes (Signed)
Patient pacing this morning reporting that she is to be discharged this morning to get to her black daughter and black husband so that she can do her little girls hair and take her to school.

## 2021-08-11 ENCOUNTER — Encounter (HOSPITAL_COMMUNITY): Payer: Self-pay

## 2021-08-11 DIAGNOSIS — F23 Brief psychotic disorder: Secondary | ICD-10-CM | POA: Insufficient documentation

## 2021-08-11 LAB — HEMOGLOBIN A1C
Hgb A1c MFr Bld: 5.2 % (ref 4.8–5.6)
Mean Plasma Glucose: 103 mg/dL

## 2021-08-11 LAB — LIPID PANEL
Cholesterol: 170 mg/dL (ref 0–200)
HDL: 79 mg/dL (ref >40)
LDL Cholesterol: 80 mg/dL (ref 0–99)
Total CHOL/HDL Ratio: 2.2 RATIO
Triglycerides: 55 mg/dL (ref <150)
VLDL: 11 mg/dL (ref 0–40)

## 2021-08-11 LAB — VITAMIN D 25 HYDROXY (VIT D DEFICIENCY, FRACTURES): Vit D, 25-Hydroxy: 52.66 ng/mL (ref 30–100)

## 2021-08-11 LAB — TSH: TSH: 2.317 u[IU]/mL (ref 0.350–4.500)

## 2021-08-11 LAB — VITAMIN B12: Vitamin B-12: 394 pg/mL (ref 180–914)

## 2021-08-11 NOTE — Group Note (Signed)
Recreation Therapy Group Note   Group Topic:Stress Management  Group Date: 08/11/2021 Start Time: 0930 End Time: 0950 Facilitators: Caroll Rancher, LRT,CTRS Location: [address] Dayroom   Goal Area(s) Addresses:  Patient will actively participate in stress management techniques presented during session.  Patient will successfully identify benefit of practicing stress management post d/c.   Group Description: Guided Imagery. LRT provided education, instruction, and demonstration on practice of visualization via guided imagery. Patient was asked to participate in the technique introduced during session. LRT debriefed including topics of mindfulness, stress management and specific scenarios each patient could use these techniques. Patients were given suggestions of ways to access scripts post d/c and encouraged to explore Youtube and other apps available on smartphones, tablets, and computers.  Affect/Mood: N/A   Participation Level: N/A    Clinical Observations/Individualized Feedback: Unable to do group session due to morning goals group running over time.     Plan: Continue to engage patient in RT group sessions 2-3x/week.   Caroll Rancher, LRT,CTRS 08/11/2021 1:25 PM
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
## 2021-08-11 NOTE — Progress Notes (Signed)
Patient ID: Catherine Hall, female   DOB: Mar 19, 1981, 41 y.o.   MRN: [id number]   Pt c/o of left neck pain at 4/10 "from sleeping wrong on it." Tylenol 650 mg, PRN given.

## 2021-08-11 NOTE — BH IP Treatment Plan (Signed)
Interdisciplinary Treatment and Diagnostic Plan Update  08/11/2021 Time of Session: 9:10am  Fusaye Wachtel MRN: [id number]  Principal Diagnosis: Bipolar affective disorder, current episode manic ([place])  Secondary Diagnoses: Principal Problem:   Bipolar affective disorder, current episode manic ([place])   Current Medications:  Current Facility-Administered Medications  Medication Dose Route Frequency Provider Last Rate Last Admin   alum & mag hydroxide-simeth (MAALOX/MYLANTA) 200-200-20 MG/5ML suspension 30 mL  30 mL Oral Q4H PRN Starkes-Perry, Gayland Curry, FNP       haloperidol (HALDOL) tablet 2 mg  2 mg Oral Q6H PRN Maida Sale, MD   2 mg at 08/10/21 1600   And   LORazepam (ATIVAN) tablet 2 mg  2 mg Oral Q6H PRN Maida Sale, MD   2 mg at 08/10/21 1600   And   benztropine (COGENTIN) tablet 1 mg  1 mg Oral Q6H PRN Maida Sale, MD   1 mg at 08/10/21 1600   haloperidol lactate (HALDOL) injection 2 mg  2 mg Intramuscular Q6H PRN Maida Sale, MD       And   LORazepam (ATIVAN) injection 2 mg  2 mg Intramuscular Q6H PRN Hill, Jackie Plum, MD       And   benztropine mesylate (COGENTIN) injection 1 mg  1 mg Intramuscular Q6H PRN Hill, Jackie Plum, MD       ibuprofen (ADVIL) tablet 400 mg  400 mg Oral Q6H PRN Maida Sale, MD   400 mg at 08/11/21 1252   LORazepam (ATIVAN) tablet 1 mg  1 mg Oral QHS PRN Maida Sale, MD       magnesium hydroxide (MILK OF MAGNESIA) suspension 30 mL  30 mL Oral Daily PRN Starkes-Perry, Gayland Curry, FNP       QUEtiapine (SEROQUEL) tablet 50 mg  50 mg Oral QHS Hill, Jackie Plum, MD   50 mg at 08/10/21 2130   PTA Medications: Medications Prior to Admission  Medication Sig Dispense Refill Last Dose   Cholecalciferol (VITAMIN D3 PO) Take 1 tablet by mouth daily.      ibuprofen (ADVIL) 200 MG tablet Take 200 mg by mouth every 6 (six) hours as needed for headache.      Misc Natural Products (MAGIC MUSHROOM MIX)  CAPS Take 1 capsule by mouth daily.      PEPPERMINT OIL PO Take 1 application by mouth every 2 (two) hours as needed (headache/migraine). Roll on peppermint stick - apply to forehead      VITAMIN A PO Take 1 capsule by mouth daily.       Patient Stressors: Marital or family conflict   Occupational concerns   Substance abuse    Patient Strengths: Average or above average intelligence  Religious Affiliation  Supportive family/friends   Treatment Modalities: Medication Management, Group therapy, Case management,  1 to 1 session with clinician, Psychoeducation, Recreational therapy.   Physician Treatment Plan for Primary Diagnosis: Bipolar affective disorder, current episode manic ([place]) Long Term Goal(s):     Short Term Goals:    Medication Management: Evaluate patient's response, side effects, and tolerance of medication regimen.  Therapeutic Interventions: 1 to 1 sessions, Unit Group sessions and Medication administration.  Evaluation of Outcomes: Not Met  Physician Treatment Plan for Secondary Diagnosis: Principal Problem:   Bipolar affective disorder, current episode manic ([place])  Long Term Goal(s):     Short Term Goals:       Medication Management: Evaluate patient's response, side effects, and tolerance of medication regimen.  Therapeutic Interventions: 1 to 1 sessions, Unit Group sessions and Medication administration.  Evaluation of Outcomes: Not Met   RN Treatment Plan for Primary Diagnosis: Bipolar affective disorder, current episode manic ([place]) Long Term Goal(s): Knowledge of disease and therapeutic regimen to maintain health will improve  Short Term Goals: Ability to remain free from injury will improve, Ability to participate in decision making will improve, Ability to verbalize feelings will improve, Ability to disclose and discuss suicidal ideas, and Ability to identify and develop effective coping behaviors will improve  Medication Management: RN will  administer medications as ordered by provider, will assess and evaluate patient's response and provide education to patient for prescribed medication. RN will report any adverse and/or side effects to prescribing provider.  Therapeutic Interventions: 1 on 1 counseling sessions, Psychoeducation, Medication administration, Evaluate responses to treatment, Monitor vital signs and CBGs as ordered, Perform/monitor CIWA, COWS, AIMS and Fall Risk screenings as ordered, Perform wound care treatments as ordered.  Evaluation of Outcomes: Not Met   LCSW Treatment Plan for Primary Diagnosis: Bipolar affective disorder, current episode manic ([place]) Long Term Goal(s): Safe transition to appropriate next level of care at discharge, Engage patient in therapeutic group addressing interpersonal concerns.  Short Term Goals: Engage patient in aftercare planning with referrals and resources, Increase social support, Increase emotional regulation, Facilitate acceptance of mental health diagnosis and concerns, Identify triggers associated with mental health/substance abuse issues, and Increase skills for wellness and recovery  Therapeutic Interventions: Assess for all discharge needs, 1 to 1 time with Social worker, Explore available resources and support systems, Assess for adequacy in community support network, Educate family and significant other(s) on suicide prevention, Complete Psychosocial Assessment, Interpersonal group therapy.  Evaluation of Outcomes: Not Met   Progress in Treatment: Attending groups: Yes. Participating in groups: Yes. Taking medication as prescribed: Yes. Toleration medication: Yes. Family/Significant other contact made: Yes, individual(s) contacted:  Sister  Patient understands diagnosis: No. Discussing patient identified problems/goals with staff: Yes. Medical problems stabilized or resolved: Yes. Denies suicidal/homicidal ideation: Yes. Issues/concerns per patient self-inventory:  No.   New problem(s) identified: No, Describe:  None   New Short Term/Long Term Goal(s): medication stabilization, elimination of SI thoughts, development of comprehensive mental wellness plan.   Patient Goals: "To spread hope and to go home to my daughter"  Discharge Plan or Barriers: Patient recently admitted. CSW will continue to follow and assess for appropriate referrals and possible discharge planning.   Reason for Continuation of Hospitalization: Aggression Delusions  Depression Medication stabilization  Estimated Length of Stay: 3 to 5 days    Scribe for Treatment Team: Carney Harder 08/11/2021 1:39 PM

## 2021-08-11 NOTE — BHH Suicide Risk Assessment (Signed)
[facility] Admission Suicide Risk Assessment   Nursing information obtained from:  Patient Demographic factors:  NA Current Mental Status:  NA Loss Factors:  NA Historical Factors:  NA Risk Reduction Factors:  NA  Total Time spent with patient:  70 minutes Principal Problem: Acute psychosis ([place]) Diagnosis:  Principal Problem:   Acute psychosis ([place])  Subjective Data: "I feel like God has put me here for a reason."  She H&P by this writer  Continued Clinical Symptoms:  Alcohol Use Disorder Identification Test Final Score (AUDIT): 0 The "Alcohol Use Disorders Identification Test", Guidelines for Use in Primary Care, Second Edition.  World Pharmacologist [facility]). Score between 0-7:  no or low risk or alcohol related problems. Score between 8-15:  moderate risk of alcohol related problems. Score between 16-19:  high risk of alcohol related problems. Score 20 or above:  warrants further diagnostic evaluation for alcohol dependence and treatment.   CLINICAL FACTORS:   Severe Anxiety and/or Agitation Currently Psychotic-both on initial presentation/admission, and improving   Musculoskeletal: Strength & Muscle Tone: within normal limits Gait & Station: normal Patient leans: N/A  Psychiatric Specialty Exam:  Presentation  General Appearance: Appropriate for Environment; Casual; Neat  Eye Contact:Good  Speech:Clear and Coherent; Normal Rate  Speech Volume:Normal  Handedness:Right   Mood and Affect  Mood:Dysphoric  Affect:Congruent   Thought Process  Thought Processes:Linear  Descriptions of Associations:Intact  Orientation:Full (Time, Place and Person)  Thought Content:Logical  History of Schizophrenia/Schizoaffective disorder:No  Duration of Psychotic Symptoms:Less than six months  Hallucinations:Hallucinations: None  Ideas of Reference:None  Suicidal Thoughts:Suicidal Thoughts: No  Homicidal Thoughts:Homicidal Thoughts: No   Sensorium   Memory:Immediate Good; Recent Good; Remote Good  Judgment:Fair  Insight:Fair   Executive Functions  Concentration:Good  Attention Span:Good  [place] of Knowledge:Good  Language:Good   Psychomotor Activity  Psychomotor Activity:Psychomotor Activity: Normal   Assets  Assets:Communication Skills; Desire for Improvement; Financial Resources/Insurance; Housing; Intimacy; Physical Health; Resilience; Social Support; Transportation; Vocational/Educational; Talents/Skills   Sleep  Sleep:Sleep: Good Number of Hours of Sleep: 7.5    Physical Exam: Physical Exam ROS Blood pressure 112/75, pulse 97, temperature 98 F (36.7 C), temperature source Oral, resp. rate 17, height 5\' 6"  (1.676 m), weight 54.1 kg, SpO2 99 %. Body mass index is 19.24 kg/m. Physical Exam Constitutional:      Appearance: Normal appearance. She is normal weight.  HENT:     Head: Normocephalic and atraumatic.  Cardiovascular:     Rate and Rhythm: Normal rate.  Pulmonary:     Effort: Pulmonary effort is normal. No respiratory distress.  Neurological:     General: No focal deficit present.     Mental Status: She is alert and oriented to person, place, and time.  Patient states that her heart rate had been elevated when checked while she was stressed. Review of Systems  Constitutional: Negative.   Respiratory: Negative.    Cardiovascular: Negative.   Gastrointestinal: Negative.   Musculoskeletal: Negative.   Neurological: Negative.   Psychiatric/Behavioral:  Negative for depression, hallucinations, memory loss, substance abuse and suicidal ideas. The patient is not nervous/anxious and does not have insomnia.     COGNITIVE FEATURES THAT CONTRIBUTE TO RISK:  None    SUICIDE RISK:   Minimal: No identifiable suicidal ideation.  Patients presenting with no risk factors but with morbid ruminations; may be classified as minimal risk based on the severity of the depressive symptoms  PLAN OF  CARE:   Treatment Plan Summary: Daily contact with  patient to assess and evaluate symptoms and progress in treatment and Medication management   Observation Level/Precautions:  15 minute checks  Laboratory:   Reviewed labs from emergency department which are unremarkable. Psychosis work-up pending  Psychotherapy: Encouraged to be active in milieu and with group therapies  Medications: Seroquel 50 mg daily at bedtime; agitation protocol in place  Consultations: None  Discharge Concerns: Patient and family safety (guns in the home)  Estimated LOS: 3-5 days  Other: Patient requests referral for outpatient mental health services    Physician Treatment Plan for Primary Diagnosis: Bipolar affective disorder, current episode manic ([place]) Long Term Goal(s): Improvement in symptoms so as ready for discharge   Short Term Goals: Ability to identify changes in lifestyle to reduce recurrence of condition will improve, Ability to verbalize feelings will improve, Ability to demonstrate self-control will improve, Ability to identify and develop effective coping behaviors will improve, and Ability to maintain clinical measurements within normal limits will improve   Physician Treatment Plan for Secondary Diagnosis: Principal Problem:   Bipolar affective disorder, current episode manic ([place])   Long Term Goal(s): Improvement in symptoms so as ready for discharge   Short Term Goals: Ability to identify changes in lifestyle to reduce recurrence of condition will improve, Ability to verbalize feelings will improve, Ability to demonstrate self-control will improve, Ability to identify and develop effective coping behaviors will improve, and Ability to maintain clinical measurements within normal limits will improve    I certify that inpatient services furnished can reasonably be expected to improve the patient's condition.   Lavella Hammock, MD 08/11/2021, 6:26 PM

## 2021-08-11 NOTE — Group Note (Signed)
LCSW Group Therapy Note   Group Date: 08/11/2021 Start Time: 1300 End Time: 1400   Type of Therapy and Topic:  Group Therapy:  Strengths Exploration   Participation Level: Active  Description of Group: This group allows individuals to explore their strengths, learn to use strengths in new ways to improve well-being. Strengths-based interventions involve identifying strengths, understanding how they are used, and learning new ways to apply them. Individuals will identify their strengths, and then explore their roles in different areas of life (relationships, professional life, and personal fulfillment). Individuals will think about ways in which they currently use their strengths, along with new ways they could begin using them.    Therapeutic Goals Patient will verbalize two of their strengths Patient will identify how their strengths are currently used Patient will identify two new ways to apply their strengths  Patients will create a plan to apply their strengths in their daily lives     Summary of Patient Progress:  Patient attended and participated in group. Patient was hyper-religious throughout group discussion. Patient shared she has no personal strengths and all strengths are through her lord.        Therapeutic Modalities Cognitive Behavioral Therapy Motivational Interviewing

## 2021-08-11 NOTE — Plan of Care (Signed)
  Problem: Activity: Goal: Interest or engagement in activities will improve Outcome: Progressing   Problem: Coping: Goal: Ability to verbalize frustrations and anger appropriately will improve Outcome: Progressing   Problem: Coping: Goal: Ability to demonstrate self-control will improve Outcome: Progressing   Problem: Safety: Goal: Periods of time without injury will increase Outcome: Progressing   

## 2021-08-11 NOTE — Progress Notes (Signed)
°   08/11/21 1108  Psych Admission Type (Psych Patients Only)  Admission Status Involuntary  Psychosocial Assessment  Patient Complaints Anxiety  Eye Contact Fair  Facial Expression Anxious  Affect Appropriate to circumstance  Speech Logical/coherent  Interaction Assertive  Motor Activity Other (Comment) (WNL)  Appearance/Hygiene Unremarkable  Behavior Characteristics Cooperative  Mood Anxious  Aggressive Behavior  Effect No apparent injury  Thought Process  Coherency Circumstantial  Content Religiosity  Delusions None reported or observed  Perception WDL  Hallucination None reported or observed  Judgment Impaired  Confusion None  Danger to Self  Current suicidal ideation? Denies  Danger to Others  Danger to Others None reported or observed
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
## 2021-08-11 NOTE — BHH Suicide Risk Assessment (Signed)
BHH INPATIENT:  Family/Significant Other Suicide Prevention Education  Suicide Prevention Education:  Education Completed; Drenda Freeze,  [phone]  (name of family member/significant other) has been identified by the patient as the family member/significant other with whom the patient will be residing, and identified as the person(s) who will aid the patient in the event of a mental health crisis (suicidal ideations/suicide attempt).  With written consent from the patient, the family member/significant other has been provided the following suicide prevention education, prior to the and/or following the discharge of the patient.  CSW spoke with patient sister who reports that God brought her sister to the hospital.  Sister reports no issues with her sister and that she is in the right place that God wants her to be.  Sister reports that patient was stressed at work and had not been getting as much sleep but has now slept and feels like she is good.  No additional stressors.  Sister agreed to get guns out of the house and that she would take them to her house.   The suicide prevention education provided includes the following: Suicide risk factors Suicide prevention and interventions National Suicide Hotline telephone number [facility] assessment telephone number [facility] Emergency Assistance 911 [facility] and/or Residential Mobile Crisis Unit telephone number  Request made of family/significant other to: Remove weapons (e.g., guns, rifles, knives), all items previously/currently identified as safety concern.   Remove drugs/medications (over-the-counter, prescriptions, illicit drugs), all items previously/currently identified as a safety concern.  The family member/significant other verbalizes understanding of the suicide prevention education information provided.  The family member/significant other agrees to remove the items of safety concern listed above.  Wylene Weissman E  Debhora Titus 08/11/2021, 4:07 PM

## 2021-08-11 NOTE — Progress Notes (Signed)
Pt denies SI, HI, AVH and pain when assessed. Visible in milieu and dayroom for scheduled groups and activities off unit with peers. Presents with fair eye contact, logical speech but is very hyper-religious, hyper-verbal and restless on interactions. Per pt  "I only hear Jesus 's voice, he's leading me to heal broken souls not commanding me" when asked of hallucinations. Rates her anxiety and depression both 0/10 "We have to cast all our care upon him". Reports she slept well with fair appetite. Required multiple verbal redirections to stop touching peers as it could be a trigger to others "Well, I just want to pray for them". Support and reassurance provided to pt. Safety checks maintained at Q 15 minutes intervals without incident. All medications administered as ordered with verbal education and effects monitored. Received PRN Motrin 400 mg PO at 1252 for complain of headache 7/10. Reported relief when reassessed at 1350. Remains medication. Denies adverse drug reaction. Tolerates meals and fluids well. Safety maintained on and off unit.

## 2021-08-11 NOTE — Progress Notes (Signed)
°   08/11/21 2058  Psych Admission Type (Psych Patients Only)  Admission Status Involuntary  Psychosocial Assessment  Patient Complaints Anxiety;Irritability  Eye Contact Fair  Facial Expression Anxious  Affect Appropriate to circumstance  Speech Logical/coherent  Interaction Assertive  Motor Activity Other (Comment) (WDL)  Appearance/Hygiene Unremarkable  Behavior Characteristics Cooperative;Appropriate to situation  Mood Pleasant  Thought Process  Coherency Circumstantial  Content Religiosity  Delusions None reported or observed  Perception WDL  Hallucination None reported or observed  Judgment Impaired  Confusion None  Danger to Self  Current suicidal ideation? Denies  Danger to Others  Danger to Others None reported or observed

## 2021-08-11 NOTE — Progress Notes (Signed)
°   08/11/21 0611  Vital Signs  Pulse Rate (!) 150  Pulse Rate Source Monitor  BP 105/74  BP Location Right Arm  BP Method Automatic  Patient Position (if appropriate) Standing  Oxygen Therapy  SpO2 99 %  Sleep  Number of Hours 7.5

## 2021-08-12 LAB — RPR: RPR Ser Ql: NONREACTIVE

## 2021-08-12 MED ORDER — BOOST / RESOURCE BREEZE PO LIQD CUSTOM
1.0000 | Freq: Every day | ORAL | Status: DC | PRN
  Administered 2021-08-12: 1 via ORAL
  Filled 2021-08-12 (×2): qty 1

## 2021-08-12 MED ORDER — QUETIAPINE FUMARATE ER 50 MG PO TB24
100.0000 mg | ORAL_TABLET | Freq: Every day | ORAL | Status: DC
  Administered 2021-08-12: 100 mg via ORAL
  Filled 2021-08-12 (×4): qty 2

## 2021-08-12 MED ORDER — HYDROXYZINE HCL 25 MG PO TABS
25.0000 mg | ORAL_TABLET | Freq: Three times a day (TID) | ORAL | Status: DC | PRN
  Administered 2021-08-12 (×2): 25 mg via ORAL
  Filled 2021-08-12 (×2): qty 1

## 2021-08-12 NOTE — [facility] Group Notes (Signed)
[facility] Group Notes:  (Nursing/MHT/Case Management/Adjunct)  Date:  08/12/2021  Time:  9:50 AM  Type of Therapy:   Orientation/Goals group  Participation Level:  Active  Participation Quality:  Appropriate  Affect:  Appropriate  Cognitive:  Appropriate  Insight:  Appropriate  Engagement in Group:  Engaged  Modes of Intervention:  Discussion, Education, and Orientation  Summary of Progress/Problems: Pt goal for today is to spread love, peace and happiness.  Joleene Burnham J Glyn Zendejas 08/12/2021, 9:50 AM

## 2021-08-12 NOTE — Progress Notes (Signed)
[facility] MD Progress Note  08/12/2021 6:13 PM Catherine Hall  MRN:  [id number]  History of Present Illness: Catherine Hall is a 41 y.o. female  with no past psychiatric history brought to the ED on 08/08/2021 by EMS for manic behavior. Family reports patient had started talking to people that are not in the room, speaking in religious chants and tongues. Denies [place] history.  24 hour chart reviewed.  Case reviewed in progression rounds. Per nursing, the patient has poor personal boundaries. She remains hyper religious, but per collateral, this is not far from baseline?  Subjective:  Catherine Hall was seen this afternoon on the unit. She was speaking at a regular rate and tone today. She feels that she did not sleep as well last night as she did the night before, and had lucid dreams that woke her 2 times. She recalls them, and both were religious in nature. She is able to talk about non-religous topics. She is eating regularly. She is hoping to get home in time to attend a birthday celebration on Saturday. She denies hallucinations, thoughts of harm to self or others. She recalls the psychiatrist from yesterday very fondly and felt that this interaction was very positive and helpful.   Principal Problem: Acute psychosis ([place]) Diagnosis: Principal Problem:   Acute psychosis ([place])  Total Time spent with patient: 30 minutes  Past Psychiatric History: behavioral health assessment while in the emergency department - no previous psychiatric history prior to this event  Past Medical History:  Past Medical History:  Diagnosis Date   Chicken pox    Frequent headaches     Past Surgical History:  Procedure Laterality Date   None     Family History:  Family History  Problem Relation Age of Onset   Hypertension Mother    Diabetes Mother    Breast cancer Mother    Hypothyroidism Mother    Healthy Father    Healthy Sister    Healthy Daughter    Healthy Son    Brain cancer Maternal Grandmother    Dementia  Paternal Grandfather    Family Psychiatric  History: daughter with depression and suicide attempt in teens Social History:  Social History   Substance and Sexual Activity  Alcohol Use No     Social History   Substance and Sexual Activity  Drug Use No    Social History   Socioeconomic History   Marital status: Married    Spouse name: Not on file   Number of children: Not on file   Years of education: Not on file   Highest education level: Not on file  Occupational History   Not on file  Tobacco Use   Smoking status: Former    Packs/day: 0.25    Years: 4.00    Pack years: 1.00    Types: Cigarettes    Quit date: 06/20/2000    Years since quitting: 21.1   Smokeless tobacco: Never  Substance and Sexual Activity   Alcohol use: No   Drug use: No   Sexual activity: Yes  Other Topics Concern   Not on file  Social History Narrative   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      Beliefs: Christian      Lifestyle: no regular exercise; diet is healthy - feels like has been underweight her whole life - eat well  Education: some college.         Social Determinants of Health   Financial Resource Strain: Not on file  Food Insecurity: Not on file  Transportation Needs: Not on file  Physical Activity: Not on file  Stress: Not on file  Social Connections: Not on file   Additional Social History:                         Sleep:  broken  Appetite:  Good  Current Medications: Current Facility-Administered Medications  Medication Dose Route Frequency Provider Last Rate Last Admin   alum & mag hydroxide-simeth (MAALOX/MYLANTA) 200-200-20 MG/5ML suspension 30 mL  30 mL Oral Q4H PRN Starkes-Perry, Gayland Curry, FNP       haloperidol (HALDOL) tablet 2 mg  2 mg Oral Q6H PRN Maida Sale, MD   2 mg at 08/12/21 [id number]   And   LORazepam (ATIVAN) tablet 2 mg  2 mg Oral  Q6H PRN Maida Sale, MD   2 mg at 08/12/21 [id number]   And   benztropine (COGENTIN) tablet 1 mg  1 mg Oral Q6H PRN Maida Sale, MD   1 mg at 08/12/21 [id number]   haloperidol lactate (HALDOL) injection 2 mg  2 mg Intramuscular Q6H PRN Maida Sale, MD       And   LORazepam (ATIVAN) injection 2 mg  2 mg Intramuscular Q6H PRN Zanobia Griebel, Jackie Plum, MD       And   benztropine mesylate (COGENTIN) injection 1 mg  1 mg Intramuscular Q6H PRN Inesha Sow, Jackie Plum, MD       feeding supplement (BOOST / RESOURCE BREEZE) liquid 1 Container  1 Container Oral Daily PRN Rozetta Nunnery, NP   1 Container at 08/12/21 0230   hydrOXYzine (ATARAX) tablet 25 mg  25 mg Oral TID PRN Rozetta Nunnery, NP   25 mg at 08/12/21 0229   ibuprofen (ADVIL) tablet 400 mg  400 mg Oral Q6H PRN Maida Sale, MD   400 mg at 08/12/21 0546   LORazepam (ATIVAN) tablet 1 mg  1 mg Oral QHS PRN Maida Sale, MD       magnesium hydroxide (MILK OF MAGNESIA) suspension 30 mL  30 mL Oral Daily PRN Starkes-Perry, Gayland Curry, FNP       QUEtiapine (SEROQUEL XR) 24 hr tablet 100 mg  100 mg Oral QHS Yasuko Lapage, Jackie Plum, MD        Lab Results:  Results for orders placed or performed during the hospital encounter of 08/10/21 (from the past 48 hour(s))  TSH     Status: None   Collection Time: 08/11/21  6:27 AM  Result Value Ref Range   TSH 2.317 0.350 - 4.500 uIU/mL    Comment: Performed by a 3rd Generation assay with a functional sensitivity of <=0.01 uIU/mL. Performed at [facility], [place] [address], [place], [place] [address]   Lipid panel     Status: None   Collection Time: 08/11/21  6:27 AM  Result Value Ref Range   Cholesterol 170 0 - 200 mg/dL   Triglycerides 55 <150 mg/dL   HDL 79 >40 mg/dL   Total CHOL/HDL Ratio 2.2 RATIO   VLDL 11 0 - 40 mg/dL   LDL Cholesterol 80 0 - 99 mg/dL    Comment:        Total Cholesterol/HDL:CHD Risk Coronary Heart Disease Risk Table  Men   Women  1/2 Average Risk   3.4   3.3  Average Risk       5.0   4.4  2 X Average Risk   9.6   7.1  3 X Average Risk  23.4   11.0        Use the calculated Patient Ratio above and the CHD Risk Table to determine the patient's CHD Risk.        ATP III CLASSIFICATION (LDL):  <100     mg/dL   Optimal  100-129  mg/dL   Near or Above                    Optimal  130-159  mg/dL   Borderline  160-189  mg/dL   High  >190     mg/dL   Very High Performed at [facility] [address] [place], [place] [address]   Hemoglobin A1c     Status: None   Collection Time: 08/11/21  6:27 AM  Result Value Ref Range   Hgb A1c MFr Bld 5.2 4.8 - 5.6 %    Comment: (NOTE)         Prediabetes: 5.7 - 6.4         Diabetes: >6.4         Glycemic control for adults with diabetes: <7.0    Mean Plasma Glucose 103 mg/dL    Comment: (NOTE) Performed At: [facility] [place], [place] [id number] Rush Farmer MD [id number]   Vitamin B12     Status: None   Collection Time: 08/11/21  6:34 PM  Result Value Ref Range   Vitamin B-12 394 180 - 914 pg/mL    Comment: (NOTE) This assay is not validated for testing neonatal or myeloproliferative syndrome specimens for Vitamin B12 levels. Performed at [facility], [place] [address], [place], [place] [address]   VITAMIN D 25 Hydroxy (Vit-D Deficiency, Fractures)     Status: None   Collection Time: 08/11/21  6:34 PM  Result Value Ref Range   Vit D, 25-Hydroxy 52.66 30 - 100 ng/mL    Comment: (NOTE) Vitamin D deficiency has been defined by the [place] practice guideline as a level of serum 25-OH  vitamin D less than 20 ng/mL (1,2). The Endocrine Society went on to  further define vitamin D insufficiency as a level between 21 and 29  ng/mL (2).  1. IOM (Institute of Medicine). 2010. Dietary reference intakes for  calcium and D. Orin: The  [employer]. 2. Holick MF, Binkley [place], Bischoff-Ferrari HA, et al. Evaluation,  treatment, and prevention of vitamin D deficiency: an Endocrine  Society clinical practice guideline, JCEM. 2011 Jul; 96(7): 1911-30.  Performed at [facility], [place] [address], [place], [place] [address]   RPR     Status: None   Collection Time: 08/11/21  6:34 PM  Result Value Ref Range   RPR Ser Ql NON REACTIVE NON REACTIVE    Comment: Performed at [facility], [place] [address], [place], [place] [address]    Blood Alcohol level:  Lab Results  Component Value Date   [facility] <10 [id number]    Metabolic Disorder Labs: Lab Results  Component Value Date   HGBA1C 5.2 08/11/2021   MPG 103 08/11/2021   No results found for: PROLACTIN Lab Results  Component Value Date   CHOL 170 08/11/2021  TRIG 55 08/11/2021   HDL 79 08/11/2021   CHOLHDL 2.2 08/11/2021   VLDL 11 08/11/2021   LDLCALC 80 08/11/2021   LDLCALC 73 03/17/2016    Physical Findings: AIMS: Facial and Oral Movements Muscles of Facial Expression: None, normal Lips and Perioral Area: None, normal Jaw: None, normal Tongue: None, normal,Extremity Movements Upper (arms, wrists, hands, fingers): None, normal Lower (legs, knees, ankles, toes): None, normal, Trunk Movements Neck, shoulders, hips: None, normal, Overall Severity Severity of abnormal movements (highest score from questions above): None, normal Incapacitation due to abnormal movements: None, normal Patient's awareness of abnormal movements (rate only patient's report): No Awareness, Dental Status Current problems with teeth and/or dentures?: No Does patient usually wear dentures?: No  CIWA:    COWS:     Musculoskeletal: Strength & Muscle Tone: within normal limits Gait & Station: normal Patient leans: N/A  Psychiatric Specialty Exam:  Presentation  General Appearance: Appropriate for Environment  Eye Contact:Fair  Speech:Normal Rate  Speech  Volume:Normal  Handedness:Right   Mood and Affect  Mood:Euphoric  Affect:-- (bright)   Thought Process  Thought Processes:Goal Directed  Descriptions of Associations:Circumstantial  Orientation:Full (Time, Place and Person)  Thought Content:Rumination  History of Schizophrenia/Schizoaffective disorder:No  Duration of Psychotic Symptoms:Less than six months  Hallucinations:Hallucinations: None  Ideas of Reference:-- (?religious ideation)  Suicidal Thoughts:Suicidal Thoughts: No  Homicidal Thoughts:Homicidal Thoughts: No   Sensorium  Memory:Immediate Good; Recent Fair; Remote Fair  Judgment:Fair  Insight:Shallow   Executive Functions  Concentration:Fair  Attention Span:Fair  [place]   Psychomotor Activity  Psychomotor Activity:Psychomotor Activity: Normal   Assets  Assets:Physical Health   Sleep  Sleep:Sleep: Fair Number of Hours of Sleep: 7.5    Physical Exam: Physical Exam Vitals and nursing note reviewed.  Constitutional:      Appearance: Normal appearance.  HENT:     Head: Normocephalic.  Eyes:     Extraocular Movements: Extraocular movements intact.  Pulmonary:     Effort: Pulmonary effort is normal.  Musculoskeletal:        General: Normal range of motion.     Cervical back: Normal range of motion.  Neurological:     General: No focal deficit present.     Mental Status: She is alert and oriented to person, place, and time.   Review of Systems  Constitutional:  Negative for fever.  HENT:  Negative for hearing loss.   Eyes:  Negative for blurred vision.  Cardiovascular:  Negative for chest pain.  Gastrointestinal:  Negative for nausea and vomiting.  Musculoskeletal:  Negative for myalgias.  Neurological:  Negative for dizziness and headaches.  Blood pressure 101/73, pulse 100, temperature 98.4 F (36.9 C), temperature source Oral, resp. rate 17, height 5\' 6"  (1.676 m), weight  54.1 kg, SpO2 100 %. Body mass index is 19.24 kg/m.   Treatment Plan Summary: Daily contact with patient to assess and evaluate symptoms and progress in treatment and Medication management Medications: Mood/anxiety: continue group therapy, milieu therapy, 1:1 evaluation with provider.  Adjusted seroquel to seroquel XR 100mg  PO QHS Medical: PRNs for pain, constipation, indigestion available.   Discharge to home tomorrow  Maida Sale, MD 08/12/2021, 6:13 PM

## 2021-08-12 NOTE — Progress Notes (Signed)
DAR NOTE: Patient presents with anxious affect and depressed mood.  Denies suicidal thoughts, pain, auditory and visual hallucinations.  Rates depression at 0, hopelessness at 0, and anxiety at 0.  Maintained on routine safety checks.  Medications given as prescribed.  Support and encouragement offered as needed.  Attended group and participated.  States goal for today is "spread love, hope, peace and kindness."  Patient observed socializing with peers in the dayroom.  Offered no complaint.

## 2021-08-12 NOTE — Group Note (Signed)
Recreation Therapy Group Note   Group Topic:Team Building  Group Date: 08/12/2021 Start Time: 2620 End Time: 1025 Facilitators: Caroll Rancher, LRT,CTRS Location: [address] Dayroom   Goal Area(s) Addresses:  Patient will effectively work with peer towards shared goal.  Patient will identify skills used to make activity successful.  Patient will identify how skills used during activity can be applied to reach post d/c goals.   Group Description: [employer]. In teams of 5-6, patients were given 15 craft pipe cleaners. Using the materials provided, patients were instructed to compete again the opposing team(s) to build the tallest free-standing structure from floor level. The activity was timed; difficulty increased by Clinical research associate as Production designer, theatre/television/film continued.  Systematically resources were removed with additional directions for example, placing one arm behind their back, working in silence, and shape stipulations. LRT facilitated post-activity discussion reviewing team processes and necessary communication skills involved in completion. Patients were encouraged to reflect how the skills utilized, or not utilized, in this activity can be incorporated to positively impact support systems post discharge.   Affect/Mood: Manic   Participation Level: Engaged   Participation Quality: Maximum Cues   Behavior: Cooperative   Speech/Thought Process: Focused   Insight: Good   Judgement: Good   Modes of Intervention: Competitive Play and Team-building   Patient Response to Interventions:  Engaged   Education Outcome:  Acknowledges education and In group clarification offered    Clinical Observations/Individualized Feedback: Pt was pleasant and engaged in activity.  Pt needed constant redirection to follow the rules of the activity.  Pt worked well with peers in completing the activity.  Pt expressed during processing, there needs to be a strong foundation with support system.   Pt also explained needing to develop a plan with support system to work through issues.  Pt went on to say that when a step in the plan doesn't work, "sometimes you have to backtrack and start over".    Plan: Continue to engage patient in RT group sessions 2-3x/week.   Caroll Rancher, Antonietta Jewel 08/12/2021 11:33 AM

## 2021-08-12 NOTE — Progress Notes (Signed)
Pt stated she was doing better, pt stated she woke up a few times last night due to bad dreams, pt took vistaril with HS medication to help with anxiety     08/12/21 2000  Psych Admission Type (Psych Patients Only)  Admission Status Involuntary  Psychosocial Assessment  Patient Complaints Anxiety  Eye Contact Fair  Facial Expression Anxious  Affect Appropriate to circumstance  Speech Logical/coherent  Interaction Assertive  Motor Activity Slow  Appearance/Hygiene Unremarkable  Behavior Characteristics Cooperative  Mood Pleasant  Aggressive Behavior  Effect No apparent injury  Thought Process  Coherency Circumstantial  Content Religiosity  Delusions None reported or observed  Perception WDL  Hallucination None reported or observed  Judgment Poor  Confusion None  Danger to Self  Current suicidal ideation? Denies  Danger to Others  Danger to Others None reported or observed

## 2021-08-12 NOTE — B[facility] Group Notes (Signed)
Adult Psychoeducational Group Note  Date:  08/12/2021 Time:  8:28 PM  Group Topic/Focus:  Wrap-Up Group:   The focus of this group is to help patients review their daily goal of treatment and discuss progress on daily workbooks.  Participation Level:  Active  Participation Quality:  Attentive  Affect:  Appropriate  Cognitive:  Appropriate  Insight: Appropriate  Engagement in Group:  Engaged  Modes of Intervention:  Discussion  Additional Comments:  Patient attended and participated in the Wrap-up group.  Jearl Klinefelter 08/12/2021, 8:28 PM

## 2021-08-12 NOTE — Progress Notes (Signed)
Adult Psychoeducational Group Note  Date:  08/12/2021 Time:  12:02 AM  Group Topic/Focus:  Wrap-Up Group:   The focus of this group is to help patients review their daily goal of treatment and discuss progress on daily workbooks.  Participation Level:  Active  Participation Quality:  Appropriate  Affect:  Appropriate  Cognitive:  Appropriate  Insight: Appropriate  Engagement in Group:  Engaged  Modes of Intervention:  Discussion  Additional Comments:  Pt stated her goal for today was to focus on her treatment plan. Pt stated she accomplished her goal today. Pt stated she talked with her doctor and with her social worker about her care today. Pt rated her overall day a 9 out of 10. Pt stated she was able to contact her daughter, sister, husband, friend, son and her daughter today which improved her overall day. Pt stated she felt better about herself tonight. Pt stated she was able to attend dinner tonight. Pt stated she took all medications provided today. Pt stated her appetite was pretty good today. Pt rated her sleep last night was good. Pt stated the goal tonight was to get some rest. Pt stated she had no physical pain tonight. Pt deny visual hallucinations and auditory issues tonight. Pt denies thoughts of harming herself or others. Pt stated she would alert staff if anything changed.  Catherine Hall 08/12/2021, 12:02 AM

## 2021-08-13 DIAGNOSIS — F319 Bipolar disorder, unspecified: Secondary | ICD-10-CM | POA: Insufficient documentation

## 2021-08-13 DIAGNOSIS — F23 Brief psychotic disorder: Secondary | ICD-10-CM

## 2021-08-13 DIAGNOSIS — F303 Manic episode in partial remission: Secondary | ICD-10-CM

## 2021-08-13 MED ORDER — QUETIAPINE FUMARATE ER 50 MG PO TB24: 100.0000 mg | ORAL_TABLET | Freq: Every day | ORAL | 0 refills | Status: AC

## 2021-08-13 MED ORDER — HYDROXYZINE HCL 25 MG PO TABS
25.0000 mg | ORAL_TABLET | Freq: Three times a day (TID) | ORAL | 0 refills | Status: AC | PRN
Start: 2021-08-13 — End: ?

## 2021-08-13 NOTE — BHH Suicide Risk Assessment (Signed)
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 The direct patient care time included face-to-face time with the patient, reviewing the patient's chart, communicating with other professionals, and coordinating care. Greater than 50% of this time was spent in counseling or coordinating care with the patient regarding probable causes of acute psychosis, medication management, psycho-education, and discharge planning needs.  Reviewed with patient circumstances leading up to admission to include use of a CBD gummy which had adverse effects.  Reviewed with patient that CBD can contribute to mania/psychosis that patient presented to the hospital with.  Patient is no longer showing manic or psychotic behavior since started on Seroquel XR and we regulating her sleep-wake schedule.  Reviewed with patient that she will work with her outpatient psychiatrist for further management of medications.  Reviewed importance of individual and family therapy.  Patient denies any suicidal or homicidal ideation, plan, or intent.  She denies any auditory or visual hallucinations and does not appear to be responding to internal stimuli.  Social work has coordinate with family to ensure that patient does not have access to guns upon discharge.  Patient and family are agreeable to safety planning and mental health follow-up after discharge.  Patient intends to take the next week off work, after which she will return to work for 1 month prior to her retirement.  Stressed to patient importance of having a regular sleep/wake schedule.   Musculoskeletal: Strength & Muscle Tone: within normal limits Gait & Station: normal Patient leans: N/A  Psychiatric Specialty Exam  Presentation   General Appearance: Casual; Neat  Eye Contact:Good  Speech:Normal Rate; Clear and Coherent  Speech Volume:Normal  Handedness:Right   Mood and Affect  Mood:Euthymic  Duration of Depression Symptoms: Less than two weeks  Affect:Appropriate; Full Range   Thought Process  Thought Processes:Linear  Descriptions of Associations:Intact  Orientation:Full (Time, Place and Person)  Thought Content:Logical  History of Schizophrenia/Schizoaffective disorder:No  Duration of Psychotic Symptoms:Less than six months  Hallucinations:Hallucinations: None  Ideas of Reference:None  Suicidal Thoughts:Suicidal Thoughts: No  Homicidal Thoughts:Homicidal Thoughts: No   Sensorium  Memory:Immediate Good; Recent Good; Remote Good  Judgment:Good  Insight:Good   Executive Functions  Concentration:Good  Attention Span:Good  [place] of Knowledge:Good  Language:Good   Psychomotor Activity  Psychomotor Activity:Psychomotor Activity: Normal   Assets  Assets:Communication Skills; Desire for Improvement; Financial Resources/Insurance; Housing; Intimacy; Leisure Time; Physical Health; Resilience; Social Support; Talents/Skills; Transportation; Vocational/Educational   Sleep  Sleep:Sleep: Fair Number of Hours of Sleep: 6.75 Patient slept Number of Hours: 6.75   Physical Exam: Physical Exam Vitals and nursing note reviewed.  HENT:     Head: Normocephalic and atraumatic.  Cardiovascular:     Rate and Rhythm: Tachycardia present.  Pulmonary:     Effort: Pulmonary effort is normal. No respiratory distress.  Musculoskeletal:        General: Normal range of motion.  Neurological:     General: No focal deficit present.     Mental Status: She is oriented to person, place, and time.   Review of Systems  Constitutional: Negative.   Respiratory: Negative.    Cardiovascular: Negative.   Gastrointestinal: Negative.   Musculoskeletal: Negative.   Neurological:  Negative.   Psychiatric/Behavioral:  Negative for depression, hallucinations, memory loss, substance  abuse and suicidal ideas. The patient is not nervous/anxious and does not have insomnia.   Blood pressure 121/69, pulse (!) 129, temperature 97.7 F (36.5 C), temperature source Oral, resp. rate 17, height 5\' 6"  (1.676 m), weight 54.1 kg, SpO2 100 %. Body mass index is 19.24 kg/m.  Mental Status Per Nursing Assessment::   On Admission:  NA  Demographic Factors:  Life stressors with some family tensions  Loss Factors: Planning retirement.  Historical Factors: Family history of mental illness or substance abuse and Impulsivity  Risk Reduction Factors:   Responsible for children under 40 years of age, Sense of responsibility to family, Religious beliefs about death, Employed, Living with another person, especially a relative, Positive social support, Positive therapeutic relationship, and Positive coping skills or problem solving skills  Continued Clinical Symptoms:  None applicable  Cognitive Features That Contribute To Risk:  None    Suicide Risk:  Minimal: No identifiable suicidal ideation.  Patients presenting with no risk factors but with morbid ruminations; may be classified as minimal risk based on the severity of the depressive symptoms   Follow-up Information     Jackson Latino, Counselor. Go on 08/20/2021.   Why: You have an appointment for therapy services on 08/20/21 at 3:00 pm. This provider is a faith based therapist. This appointment will be held in person. Contact information: [place], [place] [address]  P: [phone]        [place]. Call.   Why: As needed, please call to set up an appointment for med management for mental health medications. Contact information: [place] [address] [phone]                 Plan Of Care/Follow-up recommendations:  Activity:  ad lib Diet:  as  tolerated  On day of discharge following sustained improvement in the affect of this patient, continued report of euthymic mood, repeated denial of suicidal, homicidal, and other violent ideation, adequate interaction with peers, active participation in groups while on the unit, and denial of adverse reactions from medications, the treatment team decided Earnest Armfield was stable for discharge home with scheduled mental health treatment as noted above.  She was able to engage in safety planning including plan to return to nearest emergency room or contact emergency services if she feels unable to maintain her own safety or the safety of others. Patient had no further questions, comments, or concerns.  Discharge into care of her husband, who agrees to maintain patient safety.  Patient aware to return to nearest crisis center, ED or to call 911 for worsening symptoms of depression, suicidal or homicidal thoughts or AVH.   Lavella Hammock, MD 08/13/2021, 11:59 AM

## 2021-08-13 NOTE — Discharge Summary (Signed)
Physician Discharge Summary Note  Patient:  Catherine Hall is an 41 y.o., female MRN:  [id number] DOB:  [birth year] Patient phone:  [phone] (home)  Patient address:   [place] [address],  Total Time spent with patient:  35 minutes  Date of Admission:  08/10/2021 Date of Discharge: 08/13/21   Reason for Admission:   History of Present Illness: Catherine Hall is a 41 y.o. female  with no past psychiatric history brought to the ED on 08/08/2021 by EMS for manic behavior. Family reports patient had started talking to people that are not in the room, speaking in religious chants and tongues. Denies history of mental illness.  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 ([place])   Past Psychiatric History: None  Past Medical History:  Past Medical History:  Diagnosis Date   Chicken pox    Frequent headaches     Past Surgical History:  Procedure Laterality Date   None     Family History:  Family History  Problem Relation Age of Onset   Hypertension Mother    Diabetes Mother    Breast cancer Mother    Hypothyroidism Mother    Healthy Father    Healthy Sister    Healthy Daughter    Healthy Son    Brain cancer Maternal Grandmother    Dementia Paternal Grandfather    Family Psychiatric  History: Daughter depression and suicide attempt at age 24 (2019)  Social History:  Social History   Substance and Sexual Activity  Alcohol Use No     Social History   Substance and Sexual Activity  Drug Use No    Social History   Socioeconomic History   Marital status: Married    Spouse name: Not on file   Number of children: Not on file   Years of education: Not on file   Highest education level: Not on file  Occupational History   Not on file  Tobacco Use   Smoking status: Former    Packs/day: 0.25    Years: 4.00    Pack years: 1.00    Types: Cigarettes     Quit date: 06/20/2000    Years since quitting: 21.1   Smokeless tobacco: Never  Substance and Sexual Activity   Alcohol use: No   Drug use: No   Sexual activity: Yes  Other Topics Concern   Not on file  Social History Narrative   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      Beliefs: Christian      Lifestyle: no regular exercise; diet is healthy - feels like has been underweight her whole life - eat well      Education: some college.         Social Determinants of Health   Financial Resource Strain: Not on file  Food Insecurity: Not on file  Transportation Needs: Not on file  Physical Activity: Not on file  Stress: Not on file  Social Connections: Not on file    Hospital Course:  During the patient's hospitalization, patient had extensive initial psychiatric evaluation, and follow-up psychiatric evaluations every day.  Psychiatric diagnoses provided upon initial assessment: Acute psychosis  Patient's psychiatric medications were adjusted on admission: Started on Seroquel 50 mg at bedtime for psychosis and hydroxyzine 25 mg as needed  for anxiety  During the hospitalization, other adjustments were made to the patient's psychiatric medication regimen: Seroquel changed to Seroquel XR 100 mg  Patient's care was discussed during the interdisciplinary team meeting every day during the hospitalization.  The patient did not having side effects to prescribed psychiatric medication.  Gradually, patient started adjusting to milieu. The patient was evaluated each day by a clinical provider to ascertain response to treatment. Improvement was noted by the patient's report of decreasing symptoms, improved sleep and appetite, affect, medication tolerance, behavior, and participation in unit programming.  Patient was asked each day to complete a self inventory noting mood, mental status, pain, new  symptoms, anxiety and concerns.   Symptoms were reported as significantly decreased or resolved completely by discharge.  The patient reports that their mood is stable.  The patient denied having suicidal thoughts for more than 48 hours prior to discharge.  Patient denies having homicidal thoughts.  Patient denies having auditory hallucinations.  Patient denies any visual hallucinations or other symptoms of psychosis.  The patient was motivated to continue taking medication with a goal of continued improvement in mental health.   The patient reports their target psychiatric symptoms of mania/psychosis responded well to the psychiatric medications, and the patient reports overall benefit other psychiatric hospitalization. Supportive psychotherapy was provided to the patient. The patient also participated in regular group therapy while hospitalized. Coping skills, problem solving as well as relaxation therapies were also part of the unit programming.  On day of discharge, this provider reviewed with patient circumstances leading up to admission to include use of a CBD gummy which had adverse effects.  Reviewed with patient that CBD can contribute to mania/psychosis that patient presented to the hospital with.  Patient is no longer showing manic or psychotic behavior since started on Seroquel XR and we regulating her sleep-wake schedule.  Reviewed with patient that she will work with her outpatient psychiatrist for further management of medications.  Reviewed importance of individual and family therapy.  Patient denies any suicidal or homicidal ideation, plan, or intent.  She denies any auditory or visual hallucinations and does not appear to be responding to internal stimuli.  Social work has coordinate with family to ensure that patient does not have access to guns upon discharge.  Patient and family are agreeable to safety planning and mental health follow-up after discharge.  Patient intends to take the  next week off work, after which she will return to work for 1 month prior to her retirement.  Stressed to patient importance of having a regular sleep/wake schedule.    Labs were reviewed with the patient, and abnormal results were discussed with the patient.  The patient is able to verbalize their individual safety plan to this provider.  # It is recommended to the patient to continue psychiatric medications as prescribed, after discharge from the hospital.    # It is recommended to the patient to follow up with your outpatient psychiatric provider and PCP.  # It was discussed with the patient, the impact of alcohol, drugs, tobacco have been there overall psychiatric and medical wellbeing, and total abstinence from substance use was recommended the patient.ed.  # Prescriptions provided or sent directly to preferred pharmacy at discharge. Patient agreeable to plan. Given opportunity to ask questions. Appears to feel comfortable with discharge.    # In the event of worsening symptoms, the patient is instructed to call the crisis hotline, 911 and or go to the nearest ED for appropriate  evaluation and treatment of symptoms. To follow-up with primary care provider for other medical issues, concerns and or health care needs  # Patient was discharged home with family with a plan to follow up as noted below.    On day of discharge patient was sleeping and eating well.  She did not exhibit any signs of psychosis or mania.  Thoughts were logical and organized.  She denied any suicidal or homicidal ideation, and contract for safety.  Physical Findings: AIMS: Facial and Oral Movements Muscles of Facial Expression: None, normal Lips and Perioral Area: None, normal Jaw: None, normal Tongue: None, normal,Extremity Movements Upper (arms, wrists, hands, fingers): None, normal Lower (legs, knees, ankles, toes): None, normal, Trunk Movements Neck, shoulders, hips: None, normal, Overall  Severity Severity of abnormal movements (highest score from questions above): None, normal Incapacitation due to abnormal movements: None, normal Patient's awareness of abnormal movements (rate only patient's report): No Awareness, Dental Status Current problems with teeth and/or dentures?: No Does patient usually wear dentures?: No  CIWA:    COWS:     Musculoskeletal: Strength & Muscle Tone: within normal limits Gait & Station: normal Patient leans: N/A   Psychiatric Specialty Exam:  Presentation  General Appearance: Casual; Neat  Eye Contact:Good  Speech:Normal Rate; Clear and Coherent  Speech Volume:Normal  Handedness:Right   Mood and Affect  Mood:Euthymic  Affect:Appropriate; Full Range   Thought Process  Thought Processes:Linear  Descriptions of Associations:Intact  Orientation:Full (Time, Place and Person)  Thought Content:Logical  History of Schizophrenia/Schizoaffective disorder:No  Duration of Psychotic Symptoms:Less than six months  Hallucinations:Hallucinations: None  Ideas of Reference:None  Suicidal Thoughts:Suicidal Thoughts: No  Homicidal Thoughts:Homicidal Thoughts: No   Sensorium  Memory:Immediate Good; Recent Good; Remote Good  Judgment:Good  Insight:Good   Executive Functions  Concentration:Good  Attention Span:Good  Recall:Good  Fund of Knowledge:Good  Language:Good   Psychomotor Activity  Psychomotor Activity:Psychomotor Activity: Normal   Assets  Assets:Communication Skills; Desire for Improvement; Financial Resources/Insurance; Housing; Intimacy; Leisure Time; Physical Health; Resilience; Social Support; Talents/Skills; Transportation; Vocational/Educational   Sleep  Sleep:Sleep: Fair Number of Hours of Sleep: 6.75    Physical Exam: Physical Exam ROS Blood pressure 124/76, pulse (!) 108, temperature 97.7 F (36.5 C), temperature source Oral, resp. rate 17, height 5\' 6"  (1.676 m), weight 54.1 kg, SpO2  100 %. Body mass index is 19.24 kg/m.  Physical Exam Vitals and nursing note reviewed.  HENT:     Head: Normocephalic and atraumatic.  Cardiovascular:     Rate and Rhythm: Tachycardia present.  Pulmonary:     Effort: Pulmonary effort is normal. No respiratory distress.  Musculoskeletal:        General: Normal range of motion.  Neurological:     General: No focal deficit present.     Mental Status: She is oriented to person, place, and time.    Review of Systems  Constitutional: Negative.   Respiratory: Negative.    Cardiovascular: Negative.   Gastrointestinal: Negative.   Musculoskeletal: Negative.   Neurological: Negative.   Psychiatric/Behavioral:  Negative for depression, hallucinations, memory loss, substance abuse and suicidal ideas. The patient is not nervous/anxious and does not have insomnia.    Social History   Tobacco Use  Smoking Status Former   Packs/day: 0.25   Years: 4.00   Pack years: 1.00   Types: Cigarettes   Quit date: 06/20/2000   Years since quitting: 21.1  Smokeless Tobacco Never   Tobacco Cessation:  N/A, patient does not currently use tobacco products  Blood Alcohol level:  Lab Results  Component Value Date   ETH <10 [id number]    Metabolic Disorder Labs:  Lab Results  Component Value Date   HGBA1C 5.2 08/11/2021   MPG 103 08/11/2021   No results found for: PROLACTIN Lab Results  Component Value Date   CHOL 170 08/11/2021   TRIG 55 08/11/2021   HDL 79 08/11/2021   CHOLHDL 2.2 08/11/2021   VLDL 11 08/11/2021   LDLCALC 80 08/11/2021   LDLCALC 73 03/17/2016    See Psychiatric Specialty Exam and Suicide Risk Assessment completed by Attending Physician prior to discharge.  Discharge destination:  Home  Is patient on multiple antipsychotic therapies at discharge:  No   Has Patient had three or more failed trials of antipsychotic monotherapy by history:  No  Recommended Plan for Multiple Antipsychotic Therapies: NA   Allergies  as of 08/13/2021   No Known Allergies      Medication List     STOP taking these medications    Magic Mushroom Mix Caps   PEPPERMINT OIL PO       TAKE these medications      Indication  hydrOXYzine 25 MG tablet Commonly known as: ATARAX Take 1 tablet (25 mg total) by mouth 3 (three) times daily as needed for anxiety.  Indication: Feeling Anxious   ibuprofen 200 MG tablet Commonly known as: ADVIL Take 200 mg by mouth every 6 (six) hours as needed for headache.  Indication: Pain   QUEtiapine 50 MG Tb24 24 hr tablet Commonly known as: SEROQUEL XR Take 2 tablets (100 mg total) by mouth at bedtime.  Indication: Manic-Depression   VITAMIN A PO Take 1 capsule by mouth daily.  Indication: Nutritional Support   VITAMIN D3 PO Take 1 tablet by mouth daily.  Indication: Nutritional Support        Follow-up Information     Jackson Latino, Counselor. Go on 08/20/2021.   Why: You have an appointment for therapy services on 08/20/21 at 3:00 pm. This provider is a faith based therapist. This appointment will be held in person. Contact information: [place], [place] [address]  P: [phone]        [place]. Call.   Why: As needed, please call to set up an appointment for med management for mental health medications. Contact information: [place] [address] [phone]                 Follow-up recommendations:  Activity:  ad lib Diet:  as tolerated  Comments:  On day of discharge following sustained improvement in the affect of this patient, continued report of euthymic mood, repeated denial of suicidal, homicidal, and other violent ideation, adequate interaction with peers, active participation in groups while on the unit, and denial of adverse reactions from medications, the treatment team decided Josett Nichol was stable for discharge home with scheduled mental health treatment as noted above.    She was able to engage in safety planning including plan to return to nearest emergency room or contact emergency services if she feels unable to maintain her own safety or the safety of others. Patient had no further questions, comments, or concerns.  Discharge into care of her husband, who agrees to maintain patient safety.   Patient aware to return to nearest crisis center, ED or to call 911 for worsening symptoms of depression, suicidal or homicidal thoughts or AVH.  Signed: Lavella Hammock, MD 08/13/2021,  8:22 PM

## 2021-08-13 NOTE — Group Note (Signed)
LCSW Group Therapy Note  Group Date: 08/13/2021 Start Time: 1100 End Time: 1200   Type of Therapy and Topic:  Group Therapy - Healthy vs Unhealthy Coping Skills  Participation Level:  Did Not Attend   Description of Group The focus of this group was to determine what unhealthy coping techniques typically are used by group members and what healthy coping techniques would be helpful in coping with various problems. Patients were guided in becoming aware of the differences between healthy and unhealthy coping techniques. Patients were asked to identify 2-3 healthy coping skills they would like to learn to use more effectively.  Therapeutic Goals Patients learned that coping is what human beings do all day long to deal with various situations in their lives Patients defined and discussed healthy vs unhealthy coping techniques Patients identified their preferred coping techniques and identified whether these were healthy or unhealthy Patients determined 2-3 healthy coping skills they would like to become more familiar with and use more often. Patients provided support and ideas to each other   Summary of Patient Progress:  Did not attend   Therapeutic Modalities Cognitive Behavioral Therapy Motivational Interviewing  Otelia Santee, LCSWA 08/13/2021  1:17 PM

## 2021-08-13 NOTE — Progress Notes (Signed)
°   08/13/21 0530  Sleep  Number of Hours 6.75

## 2021-08-13 NOTE — Progress Notes (Signed)
D: Pt A & O X 3. Denies SI, HI, AVH and pain at this time. D/C home as ordered. Picked up in lobby by her husband.  A: D/C instructions reviewed with pt including prescriptions and follow up appointments; compliance encouraged. All belongings from assigned locker returned to pt at time of departure. All medications administered with verbal education and effects monitored. Safety checks maintained without incident till time of d/c.  R: Pt receptive to care. Compliant with medications when offered. Denies adverse drug reactions when assessed. Verbalized understanding related to d/c instructions. Signed belonging sheet in agreement with items received from locker. Ambulatory with a steady gait. Appears to be in no physical distress at time of departure.

## 2021-08-13 NOTE — Progress Notes (Signed)
°  [facility] Adult Case Management Discharge Plan :  Will you be returning to the same living situation after discharge:  Yes,  back to stay with husband At discharge, do you have transportation home?: Yes,  sister will pick patient up Do you have the ability to pay for your medications: Yes,  insurance  Release of information consent forms completed and in the chart;  Patient's signature needed at discharge.  Patient to Follow up at:  Follow-up Information     Simeon Craft, Counselor. Go on 08/20/2021.   Why: You have an appointment for therapy services on 08/20/21 at 3:00 pm. This provider is a faith based therapist. This appointment will be held in person. Contact information: [address] [place], [place] [address]  P: [phone]        [facility] Call.   Why: As needed, please call to set up an appointment for med management for mental health medications. Contact information: [address] [place] [address] [phone]                 Next level of care provider has access to [facility]:no  Safety Planning and Suicide Prevention discussed: Yes,  sister, Drenda Freeze     Has patient been referred to the Quitline?: N/A patient is not a smoker  Patient has been referred for addiction treatment: N/A  Jeray Shugart E Alexa Blish, LCSW 08/13/2021, 9:59 AM

## 2021-08-13 NOTE — Group Note (Signed)
Recreation Therapy Group Note   Group Topic:Coping Skills  Group Date: 08/13/2021 Start Time: 1005 End Time: 1045 Facilitators: Caroll Rancher, LRT,CTRS Location: [address] Day[address]   Goal Area(s) Addresses:  Patient will identify positive coping techniques. Patient will identify benefits of using coping skills post d/c.  Group Description:  Mind Map.  Patient was provided a blank template of a diagram with 32 blank boxes in a tiered system, branching from the center (similar to a bubble chart). LRT directed patients to label the middle of the diagram "Coping Skills" and consider 8 different sources in which they would need coping skills. LRT and patients came up with the first 8 instances (depression, anxiety, hopelessness, fear, crying, selfishness, pride and anger) together.  Patients were to then come up with 3 effective coping skills to address each identified area in the remaining boxes. Patients were given time to come up with coping skills for each instance.  The group would then comeback together and LRT would write the coping skills for each instance on the board.  Patients would then be able to fill in any blank boxes they may have.   Affect/Mood: Anxious   Participation Level: Engaged   Participation Quality: Independent   Behavior: Poor boundaries    Speech/Thought Process: Relevant   Insight: Good   Judgement: Good   Modes of Intervention: Group work   Patient Response to Interventions:  Attentive and Receptive   Education Outcome:  Acknowledges education   Clinical Observations/Individualized Feedback: Pt was attentive to the activity.  Pt did need redirection to respect others personal space.  When it was time to share coping skills with group, stated she didn't want to share because everyone was probably tired of hearing them.  Pt had a hard time sitting still so she would walk around the room.  Pt would also ask peers to elaborate when they identified certain  coping skills.  Pt did contribute surrender, writing out prayers, running and humbling self as coping skills.     Plan: Continue to engage patient in RT group sessions 2-3x/week.   Caroll Rancher, LRT,CTRS 08/13/2021 11:40 AM

## 2021-08-14 LAB — VITAMIN B1: Vitamin B1 (Thiamine): 123.9 nmol/L (ref 66.5–200.0)

## 2021-08-15 LAB — HEAVY METALS, BLOOD
Arsenic: 2 ug/L (ref 0–9)
Lead: <1 ug/dL (ref 0.0–3.4)
Mercury: <1 ug/L (ref 0.0–14.9)

## 2021-08-17 NOTE — [facility] Group Notes (Deleted)
Spirituality group facilitated by Chaplain Katy Galdino Hinchman, BCC.   Group Description: Group focused on topic of hope. Patients participated in facilitated discussion around topic, connecting with one another around experiences and definitions for hope. Group members engaged with visual explorer photos, reflecting on what hope looks like for them today. Group engaged in discussion around how their definitions of hope are present today in hospital.   Modalities: Psycho-social ed, Adlerian, Narrative, MI   Patient Progress: Did not attend.  

## 2023-07-13 ENCOUNTER — Other Ambulatory Visit: Payer: Self-pay | Admitting: Family Medicine

## 2023-07-13 DIAGNOSIS — Z1231 Encounter for screening mammogram for malignant neoplasm of breast: Secondary | ICD-10-CM

## 2023-08-03 ENCOUNTER — Ambulatory Visit
Admission: RE | Admit: 2023-08-03 | Discharge: 2023-08-03 | Disposition: A | Discharge status: Home / Self Care | Payer: Medicaid Other | Source: Ambulatory Visit | Attending: Family Medicine | Admitting: Family Medicine

## 2023-08-03 DIAGNOSIS — Z1231 Encounter for screening mammogram for malignant neoplasm of breast: Secondary | ICD-10-CM

## 2023-08-09 ENCOUNTER — Other Ambulatory Visit: Payer: Self-pay | Admitting: Family Medicine

## 2023-08-09 DIAGNOSIS — Z1231 Encounter for screening mammogram for malignant neoplasm of breast: Secondary | ICD-10-CM

## 2023-08-15 ENCOUNTER — Other Ambulatory Visit: Payer: Self-pay | Admitting: Family Medicine

## 2023-08-15 DIAGNOSIS — N6310 Unspecified lump in the right breast, unspecified quadrant: Secondary | ICD-10-CM

## 2023-08-15 DIAGNOSIS — Z09 Encounter for follow-up examination after completed treatment for conditions other than malignant neoplasm: Secondary | ICD-10-CM

## 2023-08-15 DIAGNOSIS — N6320 Unspecified lump in the left breast, unspecified quadrant: Secondary | ICD-10-CM

## 2023-08-17 ENCOUNTER — Other Ambulatory Visit: Payer: Medicaid Other

## 2023-09-12 ENCOUNTER — Ambulatory Visit
Admission: RE | Admit: 2023-09-12 | Discharge: 2023-09-12 | Disposition: A | Discharge status: Home / Self Care | Source: Ambulatory Visit | Attending: Family Medicine | Admitting: Family Medicine

## 2023-09-12 ENCOUNTER — Other Ambulatory Visit: Payer: Self-pay | Admitting: Family Medicine

## 2023-09-12 DIAGNOSIS — Z09 Encounter for follow-up examination after completed treatment for conditions other than malignant neoplasm: Secondary | ICD-10-CM

## 2023-09-12 DIAGNOSIS — N6320 Unspecified lump in the left breast, unspecified quadrant: Secondary | ICD-10-CM

## 2023-09-12 DIAGNOSIS — N6310 Unspecified lump in the right breast, unspecified quadrant: Secondary | ICD-10-CM

## 2023-09-14 ENCOUNTER — Ambulatory Visit
Admission: RE | Admit: 2023-09-14 | Discharge: 2023-09-14 | Disposition: A | Discharge status: Home / Self Care | Source: Ambulatory Visit | Attending: Family Medicine | Admitting: Family Medicine

## 2023-09-14 DIAGNOSIS — N6320 Unspecified lump in the left breast, unspecified quadrant: Secondary | ICD-10-CM

## 2023-09-14 HISTORY — PX: BREAST BIOPSY: SHX20

## 2023-09-15 LAB — SURGICAL PATHOLOGY
